# Patient Record
Sex: Male | Born: 1985 | Race: White | Hispanic: No | Marital: Single | Smoking: Current every day smoker
Health system: Southern US, Community
[De-identification: ages and names within clinical notes are randomized; demographics above are authoritative.]

## PROBLEM LIST (undated history)

## (undated) DIAGNOSIS — R9430 Abnormal result of cardiovascular function study, unspecified: Secondary | ICD-10-CM

## (undated) DIAGNOSIS — D6859 Other primary thrombophilia: Secondary | ICD-10-CM

## (undated) DIAGNOSIS — I82409 Acute embolism and thrombosis of unspecified deep veins of unspecified lower extremity: Secondary | ICD-10-CM

## (undated) DIAGNOSIS — D689 Coagulation defect, unspecified: Secondary | ICD-10-CM

## (undated) DIAGNOSIS — IMO0002 Reserved for concepts with insufficient information to code with codable children: Secondary | ICD-10-CM

## (undated) DIAGNOSIS — I2699 Other pulmonary embolism without acute cor pulmonale: Secondary | ICD-10-CM

## (undated) DIAGNOSIS — M545 Low back pain, unspecified: Secondary | ICD-10-CM

---

## 2006-05-13 ENCOUNTER — Emergency Department (HOSPITAL_COMMUNITY): Admission: EM | Admit: 2006-05-13 | Discharge: 2006-05-13 | Payer: Self-pay | Admitting: Emergency Medicine

## 2006-05-14 ENCOUNTER — Encounter (INDEPENDENT_AMBULATORY_CARE_PROVIDER_SITE_OTHER): Payer: Self-pay | Admitting: *Deleted

## 2006-05-14 ENCOUNTER — Inpatient Hospital Stay (HOSPITAL_COMMUNITY): Admission: EM | Admit: 2006-05-14 | Discharge: 2006-05-16 | Payer: Self-pay | Admitting: Emergency Medicine

## 2006-05-14 ENCOUNTER — Encounter: Payer: Self-pay | Admitting: Cardiovascular Disease

## 2006-05-14 ENCOUNTER — Ambulatory Visit: Payer: Self-pay | Admitting: Cardiovascular Disease

## 2006-05-15 ENCOUNTER — Ambulatory Visit: Payer: Self-pay | Admitting: Internal Medicine

## 2006-05-19 ENCOUNTER — Ambulatory Visit: Payer: Self-pay | Admitting: Family Medicine

## 2006-05-26 ENCOUNTER — Ambulatory Visit: Payer: Self-pay | Admitting: Internal Medicine

## 2006-06-03 ENCOUNTER — Ambulatory Visit: Payer: Self-pay | Admitting: Internal Medicine

## 2006-06-17 ENCOUNTER — Ambulatory Visit: Payer: Self-pay | Admitting: Internal Medicine

## 2006-07-01 ENCOUNTER — Ambulatory Visit: Payer: Self-pay | Admitting: Internal Medicine

## 2006-07-22 ENCOUNTER — Ambulatory Visit: Payer: Self-pay | Admitting: Internal Medicine

## 2006-09-10 ENCOUNTER — Ambulatory Visit: Payer: Self-pay | Admitting: Internal Medicine

## 2006-09-24 ENCOUNTER — Ambulatory Visit: Payer: Self-pay | Admitting: Internal Medicine

## 2006-10-01 ENCOUNTER — Ambulatory Visit: Payer: Self-pay | Admitting: Internal Medicine

## 2006-10-08 ENCOUNTER — Ambulatory Visit: Payer: Self-pay | Admitting: Internal Medicine

## 2006-10-14 ENCOUNTER — Ambulatory Visit: Payer: Self-pay | Admitting: Internal Medicine

## 2006-11-06 ENCOUNTER — Ambulatory Visit: Payer: Self-pay | Admitting: Internal Medicine

## 2006-12-08 ENCOUNTER — Ambulatory Visit: Payer: Self-pay | Admitting: Internal Medicine

## 2007-01-15 ENCOUNTER — Ambulatory Visit: Payer: Self-pay | Admitting: Internal Medicine

## 2007-02-04 ENCOUNTER — Encounter: Payer: Self-pay | Admitting: Internal Medicine

## 2007-02-04 DIAGNOSIS — I2699 Other pulmonary embolism without acute cor pulmonale: Secondary | ICD-10-CM

## 2007-02-04 DIAGNOSIS — I10 Essential (primary) hypertension: Secondary | ICD-10-CM | POA: Insufficient documentation

## 2007-02-04 DIAGNOSIS — E669 Obesity, unspecified: Secondary | ICD-10-CM

## 2007-02-04 DIAGNOSIS — R159 Full incontinence of feces: Secondary | ICD-10-CM | POA: Insufficient documentation

## 2007-12-10 ENCOUNTER — Encounter (INDEPENDENT_AMBULATORY_CARE_PROVIDER_SITE_OTHER): Payer: Self-pay | Admitting: Emergency Medicine

## 2007-12-10 ENCOUNTER — Ambulatory Visit: Payer: Self-pay | Admitting: Vascular Surgery

## 2007-12-10 ENCOUNTER — Emergency Department (HOSPITAL_COMMUNITY): Admission: EM | Admit: 2007-12-10 | Discharge: 2007-12-10 | Payer: Self-pay | Admitting: Emergency Medicine

## 2007-12-11 ENCOUNTER — Ambulatory Visit: Payer: Self-pay | Admitting: Internal Medicine

## 2007-12-11 ENCOUNTER — Ambulatory Visit: Payer: Self-pay | Admitting: Family Medicine

## 2007-12-11 DIAGNOSIS — I82409 Acute embolism and thrombosis of unspecified deep veins of unspecified lower extremity: Secondary | ICD-10-CM | POA: Insufficient documentation

## 2007-12-11 LAB — CONVERTED CEMR LAB
ALT: 47 units/L (ref 0–53)
AST: 26 units/L (ref 0–37)
Albumin: 4.8 g/dL (ref 3.5–5.2)
Alkaline Phosphatase: 54 units/L (ref 39–117)
BUN: 12 mg/dL (ref 6–23)
Basophils Absolute: 0 10*3/uL (ref 0.0–0.1)
Basophils Relative: 1 % (ref 0–1)
CO2: 24 meq/L (ref 19–32)
Calcium: 9.6 mg/dL (ref 8.4–10.5)
Chloride: 102 meq/L (ref 96–112)
Creatinine, Ser: 0.94 mg/dL (ref 0.40–1.50)
Eosinophils Absolute: 0.3 10*3/uL (ref 0.0–0.7)
Eosinophils Relative: 5 % (ref 0–5)
Glucose, Bld: 80 mg/dL (ref 70–99)
HCT: 51.1 % (ref 39.0–52.0)
Hemoglobin: 16.9 g/dL (ref 13.0–17.0)
Homocysteine: 40.2 micromoles/L — ABNORMAL HIGH (ref 4.0–15.4)
INR: 1.1
Lymphocytes Relative: 33 % (ref 12–46)
Lymphs Abs: 2.1 10*3/uL (ref 0.7–4.0)
MCHC: 33.1 g/dL (ref 30.0–36.0)
MCV: 91.3 fL (ref 78.0–100.0)
Monocytes Absolute: 0.5 10*3/uL (ref 0.1–1.0)
Monocytes Relative: 8 % (ref 3–12)
Neutro Abs: 3.4 10*3/uL (ref 1.7–7.7)
Neutrophils Relative %: 53 % (ref 43–77)
Platelets: 196 10*3/uL (ref 150–400)
Potassium: 4.2 meq/L (ref 3.5–5.3)
Prothrombin Time: 13.1 s
RBC: 5.6 M/uL (ref 4.22–5.81)
RDW: 13.1 % (ref 11.5–15.5)
Sodium: 140 meq/L (ref 135–145)
Total Bilirubin: 1.9 mg/dL — ABNORMAL HIGH (ref 0.3–1.2)
Total Protein: 7.1 g/dL (ref 6.0–8.3)
WBC: 6.4 10*3/uL (ref 4.0–10.5)

## 2007-12-22 ENCOUNTER — Ambulatory Visit: Payer: Self-pay | Admitting: Internal Medicine

## 2007-12-25 ENCOUNTER — Encounter: Payer: Self-pay | Admitting: Internal Medicine

## 2007-12-25 ENCOUNTER — Telehealth: Payer: Self-pay | Admitting: Internal Medicine

## 2009-04-04 ENCOUNTER — Emergency Department (HOSPITAL_COMMUNITY): Admission: EM | Admit: 2009-04-04 | Discharge: 2009-04-04 | Payer: Self-pay | Admitting: Emergency Medicine

## 2010-04-27 ENCOUNTER — Emergency Department (HOSPITAL_COMMUNITY): Admission: EM | Admit: 2010-04-27 | Discharge: 2010-04-27 | Payer: Self-pay | Admitting: Emergency Medicine

## 2010-04-27 ENCOUNTER — Ambulatory Visit: Payer: Self-pay | Admitting: Vascular Surgery

## 2010-05-11 ENCOUNTER — Telehealth: Payer: Self-pay | Admitting: *Deleted
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
## 2010-10-18 LAB — GC/CHLAMYDIA PROBE AMP, GENITAL
Chlamydia, DNA Probe: NEGATIVE
GC Probe Amp, Genital: NEGATIVE

## 2010-10-27 ENCOUNTER — Emergency Department (HOSPITAL_BASED_OUTPATIENT_CLINIC_OR_DEPARTMENT_OTHER)
Admission: EM | Admit: 2010-10-27 | Discharge: 2010-10-27 | Disposition: A | Discharge status: Home / Self Care | Payer: Self-pay | Attending: Emergency Medicine | Admitting: Emergency Medicine

## 2010-10-27 DIAGNOSIS — S301XXA Contusion of abdominal wall, initial encounter: Secondary | ICD-10-CM | POA: Insufficient documentation

## 2010-10-27 DIAGNOSIS — Z8679 Personal history of other diseases of the circulatory system: Secondary | ICD-10-CM | POA: Insufficient documentation

## 2010-10-27 DIAGNOSIS — F17200 Nicotine dependence, unspecified, uncomplicated: Secondary | ICD-10-CM | POA: Insufficient documentation

## 2010-10-28 ENCOUNTER — Other Ambulatory Visit (HOSPITAL_BASED_OUTPATIENT_CLINIC_OR_DEPARTMENT_OTHER): Payer: Self-pay

## 2010-11-07 ENCOUNTER — Ambulatory Visit (HOSPITAL_BASED_OUTPATIENT_CLINIC_OR_DEPARTMENT_OTHER)
Admission: RE | Admit: 2010-11-07 | Discharge: 2010-11-07 | Disposition: A | Discharge status: Home / Self Care | Payer: Self-pay | Source: Ambulatory Visit | Attending: Emergency Medicine | Admitting: Emergency Medicine

## 2010-11-07 DIAGNOSIS — R1903 Right lower quadrant abdominal swelling, mass and lump: Secondary | ICD-10-CM

## 2010-11-07 DIAGNOSIS — R1909 Other intra-abdominal and pelvic swelling, mass and lump: Secondary | ICD-10-CM | POA: Insufficient documentation

## 2010-12-21 NOTE — H&P (Signed)
NAMETIP, ATIENZA              ACCOUNT NO.:  [id number]   MEDICAL RECORD NO.:  [id number]          PATIENT TYPE:  INP   LOCATION:  1420                         FACILITY:  [facility]   PHYSICIAN:  Reginia Forts, MD     DATE OF BIRTH:  30-Dec-1985   DATE OF ADMISSION:  05/13/2006  DATE OF DISCHARGE:  05/13/2006                                HISTORY & PHYSICAL   CHIEF COMPLAINT:  1. Shortness of breath.  2. Left lower chest pain.  3. Mildly bloody sputum.   HISTORY OF PRESENT ILLNESS:  Mr. Falco is an 25 year old Caucasian male  with a history for tobacco and obesity, who presents with 6 hours of lower  left chest pain and mild hemoptysis.  The patient denies any recent extended  travel or sedentary state, any leg swelling or family history of clotting  disorders.  He states that he has been active and was cutting tobacco stalks  earlier in the day.  Later this evening he developed acute shortness of  breath with left lower chest pain, rated as 8/10, with mild hemoptysis.  He  presented to the emergency room and underwent a CT scan demonstrating  pulmonary emboli in the lobar and segmental arteries of the left lung.  The  patient was started on heparin and morphine with subsequent improvement in  symptoms.  The patient denies any recent trauma.   PAST MEDICAL HISTORY:  Notable for obesity.  The patient has lost 50 pounds  in the last 2 years.   PAST SURGICAL HISTORY:  None.   ALLERGIES:  NO KNOWN DRUG ALLERGIES.   MEDICATIONS:  None.   SOCIAL HISTORY:  The patient currently lives with his father but is moving  to [place] with his mother after this hospital stay.  He is a Nature conservation officer.  He  has smoked 1 pack a day for the last 2-3 years.  Denies any alcohol or drug  use.   FAMILY HISTORY:  Notable for premature heart disease and cancer, but no  hypercoagulable disease or blood dyscrasias.   REVIEW OF SYSTEMS:  12-point review of systems was reviewed and was  negative.   PHYSICAL EXAMINATION:  VITAL SIGNS:  Temperature is 98.5, blood pressure is  127/72, pulse is 83 and respiratory rate is 18-20, sating at 100% on 2 L.  GENERAL:  The patient is awake, alert and oriented times three, in no acute  distress.  HEENT:  Normocephalic, atraumatic.  Pupils equal, round and reactive to  light.  Extraocular movements are intact.  NECK:  Shows no JVD and no bruits.  CARDIOVASCULAR:  Regular rhythm, normal rate.  No murmurs, rubs or gallops.  LUNGS:  Clear to auscultation bilaterally.  ABDOMEN:  Positive bowel sounds.  Soft, nontender and nondistended.  EXTREMITIES:  Show no cyanosis, clubbing or edema.  Negative Homans sign.  NEUROLOGIC:  Cranial nerves II through XII are grossly intact.  No focal  musculoskeletal or sensory deficits.  MUSCULOSKELETAL:  Demonstrates no significant tenderness.  PSYCHIATRIC:  Demonstrates normal affect.   LABORATORY DATA:  D-dimer is 2.97.  BUN is 5 and creatinine is  0.7.  White  count is 13,000, hematocrit is 45.5 and platelets are 254,000.  Urinalysis  demonstrated no protein.   RADIOLOGIC DATA:  CT scan demonstrated acute pulmonary embolus to the lobar  and segmental pulmonary arteries of the left lung.   Chest x-ray demonstrated no acute cardiopulmonary process.   ASSESSMENT AND PLAN:  This is an 25 year old Caucasian male with no obvious  precipitating factors, who presents with an acute pulmonary embolism.  Risk  factors include smoking and obesity.  1. Pulmonary embolism.  The patient has been started on a heparin drip.      He will be initiated on Coumadin.  Hypercoagulable workup will be      obtained.  The patient may or may not be a candidate for Lovenox, in      light of his weight.  2. Nutrition.  The patient will need dietary counseling to help him with      his weight loss.  3. Smoking cessation.  The patient is advised to stop smoking.  4. DVT prophylaxis.  The patient has been placed on full-dose heparin and       Coumadin and will not require basic DVT prophylaxis.      Reginia Forts, MD  Electronically Signed     RA/MEDQ  D:  05/14/2006  T:  05/14/2006  Job:  [id number]

## 2010-12-21 NOTE — Discharge Summary (Signed)
NAMEBRIER, [name]              ACCOUNT NO.:  [id number]   MEDICAL RECORD NO.:  [id number]          PATIENT TYPE:  INP   LOCATION:  1420                         FACILITY:  [facility]   PHYSICIAN:  Georgina Quint. Plotnikov, MDDATE OF BIRTH:  10-19-1985   DATE OF ADMISSION:  05/13/2006  DATE OF DISCHARGE:  05/16/2006                                 DISCHARGE SUMMARY   FINAL DIAGNOSES:  1. Was admitted with chest pain and hemoptysis on May 14, 2006.  For      the details, please address __________  history and physical.  2. Elevated blood pressure.   HOSPITAL COURSE:  During the course of hospitalization, the patient was  treated with IV heparin and subsequently switched to Lovenox and Coumadin.  He had been doing progressively better with much less chest pain and  shortness of breath on exertion.  The hemoptysis has resolved.  On the day  of discharge, he is feeling well.  There are no active complaints.   PHYSICAL EXAMINATION:  VITAL SIGNS: Temperature 97.6, heart rate 83,  respirations 18, blood pressure 125/82, saturation 90% on 2 L.  GENERAL:  Looks well.  HEENT: Moist mucosa.  LUNGS:  Clear.  No wheezes or rales.  CARDIAC:  S1 and S2.  No tachycardia.  No gallop.  ABDOMEN:  Obese, soft, nontender.  EXTREMITIES:  Lower extremities without edema.  Calves nontender.  NEUROLOGIC: Alert, oriented, cooperative.   LABORATORY DATA:  INR 1.2 (was 1.1-1), white count 10.3, hemoglobin 14.5.  BMET normal.  Calcium 9.4.  Chest x-ray normal on May 14, 2006.  CT scan  with angiogram with acute pulmonary embolism to lobar and segmental  pulmonary artery of the left lung.  Two small foci atelectasis or pulmonary  infarct in the lingula and posterolateral left lower lobe.  Borderline hilar  and subcarinal adenopathy.  EKG was normal sinus rhythm, moderate voltage  criteria for LVH.   SPECIAL INSTRUCTIONS:  Low salt diet.           ______________________________  Georgina Quint. Plotnikov,  MD    AVP/MEDQ  D:  05/16/2006  T:  05/18/2006  Job:  [id number]   cc:   Neta Mends. Fabian Sharp, MD  [address] [place]  [place] [address]

## 2010-12-21 NOTE — Discharge Summary (Signed)
Casey Gray, Casey Gray              ACCOUNT NO.:  [id number]   MEDICAL RECORD NO.:  [id number]          PATIENT TYPE:  INP   LOCATION:  1420                         [facility]ACILITY:  [facility]   PHYSICIAN:  Georgina Quint. Plotnikov, MDDATE OF BIRTH:  1986-03-03   DATE OF ADMISSION:  05/13/2006  DATE OF DISCHARGE:  05/16/2006                                 DISCHARGE SUMMARY   DISCHARGE DIAGNOSES:  1. Pulmonary embolism.  2. Chest pain and hemoptysis due to #1, resolved.  3. Elevated blood pressure.  4. Obesity.   HISTORY:  He was admitted with chest pain and hemoptysis on May 14, 2006.   DISCHARGE MEDICATIONS:  1. Coumadin 5 mg daily. Follow up with Neta Mends. Panosh, MD, next week with      INR in the office.  2. Lovenox twice daily as instructed.  He will try to receive Lovenox for      free from the company.           ______________________________  Georgina Quint Plotnikov, MD     AVP/MEDQ  D:  05/21/2006  T:  05/23/2006  Job:  [id number]   cc:   Neta Mends. Fabian Sharp, MD  [address] [place]  [place] [address]

## 2011-04-08 ENCOUNTER — Emergency Department (HOSPITAL_COMMUNITY)
Admission: EM | Admit: 2011-04-08 | Discharge: 2011-04-08 | Discharge status: Against Medical Advice | Payer: Self-pay | Attending: Emergency Medicine | Admitting: Emergency Medicine

## 2011-04-08 ENCOUNTER — Ambulatory Visit (HOSPITAL_COMMUNITY)
Admission: RE | Admit: 2011-04-08 | Discharge: 2011-04-08 | Disposition: A | Discharge status: Home / Self Care | Payer: Self-pay | Source: Ambulatory Visit | Attending: Emergency Medicine | Admitting: Emergency Medicine

## 2011-04-08 DIAGNOSIS — R079 Chest pain, unspecified: Secondary | ICD-10-CM | POA: Insufficient documentation

## 2011-04-08 DIAGNOSIS — R071 Chest pain on breathing: Secondary | ICD-10-CM | POA: Insufficient documentation

## 2012-04-07 ENCOUNTER — Emergency Department (HOSPITAL_COMMUNITY)
Admission: EM | Admit: 2012-04-07 | Discharge: 2012-04-07 | Disposition: A | Discharge status: Home / Self Care | Payer: Self-pay | Attending: Emergency Medicine | Admitting: Emergency Medicine

## 2012-04-07 ENCOUNTER — Encounter (HOSPITAL_COMMUNITY): Payer: Self-pay | Admitting: Emergency Medicine

## 2012-04-07 DIAGNOSIS — G8929 Other chronic pain: Secondary | ICD-10-CM | POA: Insufficient documentation

## 2012-04-07 DIAGNOSIS — F17200 Nicotine dependence, unspecified, uncomplicated: Secondary | ICD-10-CM | POA: Insufficient documentation

## 2012-04-07 DIAGNOSIS — M545 Low back pain, unspecified: Secondary | ICD-10-CM

## 2012-04-07 DIAGNOSIS — T148XXA Other injury of unspecified body region, initial encounter: Secondary | ICD-10-CM

## 2012-04-07 DIAGNOSIS — Z86718 Personal history of other venous thrombosis and embolism: Secondary | ICD-10-CM | POA: Insufficient documentation

## 2012-04-07 DIAGNOSIS — Z86711 Personal history of pulmonary embolism: Secondary | ICD-10-CM | POA: Insufficient documentation

## 2012-04-07 HISTORY — DX: Acute embolism and thrombosis of unspecified deep veins of unspecified lower extremity: I82.409

## 2012-04-07 HISTORY — DX: Low back pain, unspecified: M54.50

## 2012-04-07 HISTORY — DX: Low back pain: M54.5

## 2012-04-07 HISTORY — DX: Other pulmonary embolism without acute cor pulmonale: I26.99

## 2012-04-07 MED ORDER — NAPROXEN 250 MG PO TABS: 250.0000 mg | ORAL_TABLET | Freq: Two times a day (BID) | ORAL | Status: AC

## 2012-04-07 MED ORDER — METHOCARBAMOL 500 MG PO TABS: 1000.0000 mg | ORAL_TABLET | Freq: Four times a day (QID) | ORAL | Status: AC | PRN

## 2012-04-07 MED ORDER — HYDROCODONE-ACETAMINOPHEN 5-325 MG PO TABS: ORAL_TABLET | ORAL | Status: AC

## 2012-04-07 MED ORDER — IBUPROFEN 400 MG PO TABS
400.0000 mg | ORAL_TABLET | Freq: Once | ORAL | Status: AC
  Administered 2012-04-07: 400 mg via ORAL
  Filled 2012-04-07: qty 1

## 2012-04-07 MED ORDER — HYDROCODONE-ACETAMINOPHEN 5-325 MG PO TABS
1.0000 | ORAL_TABLET | Freq: Once | ORAL | Status: AC
  Administered 2012-04-07: 1 via ORAL
  Filled 2012-04-07: qty 1

## 2012-04-07 NOTE — ED Notes (Signed)
Back  Pain left side  X 3 days states hurts to take a deep breath denies injury

## 2012-04-07 NOTE — ED Provider Notes (Signed)
History    This chart was scribed for Laray Anger, DO, MD by Smitty Pluck. The patient was seen in room TR11C and the patient's care was started at 5:01PM.   CSN: [id number]  Arrival date & time 04/07/12  1419   First MD Initiated Contact with Patient 04/07/12 1701      Chief Complaint  Patient presents with  . Back Pain     HPI Casey Gray is a 26 y.o. male who presents to the Emergency Department complaining of gradual onset and persistence of constant acute flair of his chronic low back "pain" for the past 3 days.  States the pain began after he bent over and lifted a bucket of water.  Denies any change in his usual chronic pain pattern.  Pain worsens with palpation of the area and body position changes. Denies incont/retention of bowel or bladder, no saddle anesthesia, no focal motor weakness, no tingling/numbness in extremities, no fevers, no injury, no abd pain.   The symptoms have been associated with no other complaints. The patient has a significant history of similar symptoms previously.    Past Medical History  Diagnosis Date  . DVT (deep venous thrombosis)   . Pulmonary embolism   . Low back pain     No past surgical history on file.    History  Substance Use Topics  . Smoking status: Current Everyday Smoker  . Smokeless tobacco: Not on file  . Alcohol Use: Yes      Review of Systems ROS: Statement: All systems negative except as marked or noted in the HPI; Constitutional: Negative for fever and chills. ; ; Eyes: Negative for eye pain, redness and discharge. ; ; ENMT: Negative for ear pain, hoarseness, nasal congestion, sinus pressure and sore throat. ; ; Cardiovascular: Negative for chest pain, palpitations, diaphoresis, dyspnea and peripheral edema. ; ; Respiratory: Negative for cough, wheezing and stridor. ; ; Gastrointestinal: Negative for nausea, vomiting, diarrhea, abdominal pain, blood in stool, hematemesis, jaundice and rectal bleeding. . ; ;  Genitourinary: Negative for dysuria, flank pain and hematuria. ; ; Musculoskeletal: +LBP. Negative for neck pain. Negative for swelling and trauma.; ; Skin: Negative for pruritus, rash, abrasions, blisters, bruising and skin lesion.; ; Neuro: Negative for headache, lightheadedness and neck stiffness. Negative for weakness, altered level of consciousness , altered mental status, extremity weakness, paresthesias, involuntary movement, seizure and syncope.       Allergies  Review of patient's allergies indicates no known allergies.  Home Medications  No current outpatient prescriptions on file.  BP 130/81  Pulse 65  Temp 98.1 F (36.7 C) (Oral)  Resp 16  SpO2 98%  Physical Exam 1705: Physical examination:  Nursing notes reviewed; Vital signs and O2 SAT reviewed;  Constitutional: Well developed, Well nourished, Well hydrated, In no acute distress; Head:  Normocephalic, atraumatic; Eyes: EOMI, PERRL, No scleral icterus; ENMT: Mouth and pharynx normal, Mucous membranes moist; Neck: Supple, Full range of motion, No lymphadenopathy; Cardiovascular: Regular rate and rhythm, No murmur, rub, or gallop; Respiratory: Breath sounds clear & equal bilaterally, No rales, rhonchi, wheezes.  Speaking full sentences with ease, Normal respiratory effort/excursion; Chest: Nontender, Movement normal; Spine:  No midline CS, TS, LS tenderness.  +TTP left lumbar paraspinal muscles.;;; Genitourinary: No CVA tenderness; Extremities: Pulses normal, No tenderness, No edema, No calf edema or asymmetry.; Neuro: AA&Ox3, Major CN grossly intact.  Speech clear. Strength 5/5 equal bilat UE's and LE's, including great toe dorsiflexion.  DTR 2/4 equal bilat UE's  and LE's.  No gross sensory deficits.  Neg straight leg raises bilat. Gait steady.;; Skin: Color normal, Warm, Dry.   ED Course  Procedures   MDM  MDM Reviewed: nursing note, previous chart and vitals      1710:  Hx of chronic low back pain.  Pt endorses acute  flair of his usual chronic pain today, that began after "lifting a bucket of water."  Denies any  change from his usual chronic pain pattern.  Pt encouraged to f/u with his PMD for good continuity of care and control of his chronic pain.  Verb understanding.       I personally performed the services described in this documentation, which was scribed in my presence. The recorded information has been reviewed and considered. Nicholle Falzon Allison Quarry, DO 04/09/12 1653

## 2012-04-07 NOTE — ED Notes (Signed)
Patient C/O Back pain for 2 days.  Pain is in his left flank. Denies lumbar pain.  States that he picked up a bucket and felt a pop in his back and states that he could not move for a few minutes. Left flank is tender to palpation and patient states that it hurts to take a deep breath. Denies change in urinary pattern.

## 2014-03-04 ENCOUNTER — Encounter (HOSPITAL_COMMUNITY): Payer: Self-pay | Admitting: Emergency Medicine

## 2014-03-04 ENCOUNTER — Emergency Department (HOSPITAL_COMMUNITY)
Admission: EM | Admit: 2014-03-04 | Discharge: 2014-03-04 | Disposition: A | Discharge status: Home / Self Care | Payer: Self-pay | Attending: Emergency Medicine | Admitting: Emergency Medicine

## 2014-03-04 ENCOUNTER — Emergency Department (HOSPITAL_COMMUNITY): Payer: Self-pay

## 2014-03-04 DIAGNOSIS — Z86711 Personal history of pulmonary embolism: Secondary | ICD-10-CM | POA: Insufficient documentation

## 2014-03-04 DIAGNOSIS — Z86718 Personal history of other venous thrombosis and embolism: Secondary | ICD-10-CM | POA: Insufficient documentation

## 2014-03-04 DIAGNOSIS — M5380 Other specified dorsopathies, site unspecified: Secondary | ICD-10-CM | POA: Insufficient documentation

## 2014-03-04 DIAGNOSIS — R079 Chest pain, unspecified: Secondary | ICD-10-CM | POA: Insufficient documentation

## 2014-03-04 DIAGNOSIS — F17200 Nicotine dependence, unspecified, uncomplicated: Secondary | ICD-10-CM | POA: Insufficient documentation

## 2014-03-04 DIAGNOSIS — M62830 Muscle spasm of back: Secondary | ICD-10-CM

## 2014-03-04 LAB — BASIC METABOLIC PANEL
Anion gap: 11 (ref 5–15)
BUN: 15 mg/dL (ref 6–23)
CALCIUM: 9.2 mg/dL (ref 8.4–10.5)
CHLORIDE: 103 meq/L (ref 96–112)
CO2: 27 mEq/L (ref 19–32)
Creatinine, Ser: 0.92 mg/dL (ref 0.50–1.35)
GFR calc Af Amer: >90 mL/min (ref >90)
Glucose, Bld: 81 mg/dL (ref 70–99)
POTASSIUM: 4.7 meq/L (ref 3.7–5.3)
Sodium: 141 mEq/L (ref 137–147)

## 2014-03-04 LAB — CBC
HEMATOCRIT: 49.2 % (ref 39.0–52.0)
Hemoglobin: 16.6 g/dL (ref 13.0–17.0)
MCH: 30.1 pg (ref 26.0–34.0)
MCHC: 33.7 g/dL (ref 30.0–36.0)
MCV: 89.3 fL (ref 78.0–100.0)
Platelets: 202 10*3/uL (ref 150–400)
RBC: 5.51 MIL/uL (ref 4.22–5.81)
RDW: 12.5 % (ref 11.5–15.5)
WBC: 6 10*3/uL (ref 4.0–10.5)

## 2014-03-04 LAB — I-STAT TROPONIN, ED: Troponin i, poc: 0.01 ng/mL (ref 0.00–0.08)

## 2014-03-04 LAB — PRO B NATRIURETIC PEPTIDE: Pro B Natriuretic peptide (BNP): 10.6 pg/mL (ref 0–125)

## 2014-03-04 LAB — PROTIME-INR
INR: 0.97 (ref 0.00–1.49)
PROTHROMBIN TIME: 12.9 s (ref 11.6–15.2)

## 2014-03-04 LAB — D-DIMER, QUANTITATIVE (NOT AT ARMC)

## 2014-03-04 MED ORDER — CYCLOBENZAPRINE HCL 10 MG PO TABS: 10.0000 mg | ORAL_TABLET | Freq: Two times a day (BID) | ORAL | Status: DC | PRN

## 2014-03-04 MED ORDER — IBUPROFEN 600 MG PO TABS: 600.0000 mg | ORAL_TABLET | Freq: Four times a day (QID) | ORAL | Status: DC | PRN

## 2014-03-04 MED ORDER — DIAZEPAM 2 MG PO TABS
2.0000 mg | ORAL_TABLET | Freq: Once | ORAL | Status: AC
  Administered 2014-03-04: 2 mg via ORAL
  Filled 2014-03-04: qty 1

## 2014-03-04 NOTE — ED Provider Notes (Signed)
CSN: [id number]     Arrival date & time 03/04/14  1310 History   None    Chief Complaint  Patient presents with  . rib pain      (Consider location/radiation/quality/duration/timing/severity/associated sxs/prior Treatment) Patient is a 28 y.o. male presenting with back pain. The history is provided by the patient and medical records.  Back Pain Location:  Lumbar spine Quality:  Stabbing Radiates to:  Does not radiate Pain severity:  Moderate Pain is:  Worse during the night Onset quality:  Sudden Duration:  3 days Timing:  Intermittent Progression:  Waxing and waning Chronicity:  New Context: not emotional stress, not falling, not lifting heavy objects, not MCA, not MVA, not pedestrian accident, not physical stress, not recent illness and not twisting   Relieved by:  None tried Worsened by:  Deep breathing, movement, twisting and lying down Ineffective treatments:  None tried Associated symptoms: no abdominal pain, no abdominal swelling, no bladder incontinence, no bowel incontinence, no chest pain, no dysuria, no fever, no headaches, no leg pain, no numbness, no paresthesias, no pelvic pain, no perianal numbness, no tingling, no weakness and no weight loss   Risk factors: lack of exercise and obesity   Risk factors: no hx of cancer, no hx of osteoporosis, no recent surgery, no steroid use and no vascular disease   Risk factors comment:  Hx of DVT/PE   Pt endorsing 2-3 days of spontaneous R paraspinal pain.  Worse with deep inspiration.  Prior DVT/PE Hx no longer on coumadin.  Pt unsure of cause of DVT/PE.   Not 2/2 to surgery as far as he knows.  No CP.  No SOB.  Past Medical History  Diagnosis Date  . DVT (deep venous thrombosis)   . Pulmonary embolism   . Low back pain    History reviewed. No pertinent past surgical history. History reviewed. No pertinent family history. History  Substance Use Topics  . Smoking status: Current Every Day Smoker  . Smokeless tobacco: Not  on file  . Alcohol Use: Yes    Review of Systems  Constitutional: Negative.  Negative for fever and weight loss.  HENT: Negative.   Eyes: Negative.   Respiratory: Negative.   Cardiovascular: Negative.  Negative for chest pain.  Gastrointestinal: Negative.  Negative for abdominal pain and bowel incontinence.  Endocrine: Negative.   Genitourinary: Negative.  Negative for bladder incontinence, dysuria and pelvic pain.  Musculoskeletal: Positive for back pain.  Skin: Negative.   Allergic/Immunologic: Negative.   Neurological: Negative.  Negative for tingling, weakness, numbness, headaches and paresthesias.  Hematological: Negative.   Psychiatric/Behavioral: Negative.       Allergies  Review of patient's allergies indicates no known allergies.  Home Medications   Prior to Admission medications   Medication Sig Start Date End Date Taking? Authorizing Provider  cyclobenzaprine (FLEXERIL) 10 MG tablet Take 1 tablet (10 mg total) by mouth 2 (two) times daily as needed for muscle spasms. 03/04/14   Gavin PoundJustin Birgitta Uhlir, MD  ibuprofen (ADVIL,MOTRIN) 600 MG tablet Take 1 tablet (600 mg total) by mouth every 6 (six) hours as needed. 03/04/14   Gavin PoundJustin Tykera Skates, MD   BP 145/90  Pulse 74  Temp(Src) 98 F (36.7 C) (Oral)  Resp 20  SpO2 97% Physical Exam  Constitutional: He is oriented to person, place, and time. He appears well-developed and well-nourished. No distress.  HENT:  Head: Normocephalic and atraumatic.  Right Ear: External ear normal.  Left Ear: External ear normal.  Nose: Nose normal.  Mouth/Throat: Oropharynx is clear and moist. No oropharyngeal exudate.  Eyes: Conjunctivae and EOM are normal. Pupils are equal, round, and reactive to light. Right eye exhibits no discharge. Left eye exhibits no discharge. No scleral icterus.  Neck: Normal range of motion. Neck supple. No JVD present. No tracheal deviation present. No thyromegaly present.  Cardiovascular: Normal rate, regular  rhythm, normal heart sounds and intact distal pulses.  Exam reveals no gallop and no friction rub.   No murmur heard. Pulmonary/Chest: Effort normal and breath sounds normal. No stridor. No respiratory distress. He has no wheezes. He has no rales. He exhibits no tenderness.  Abdominal: Soft. Bowel sounds are normal. He exhibits no distension and no mass. There is no tenderness. There is no rebound and no guarding.  Musculoskeletal: Normal range of motion. He exhibits tenderness. He exhibits no edema.       Lumbar back: He exhibits tenderness, swelling, pain and spasm. He exhibits normal range of motion, no bony tenderness, no edema, no deformity, no laceration and normal pulse.       Arms: Lymphadenopathy:    He has no cervical adenopathy.  Neurological: He is alert and oriented to person, place, and time.  Skin: Skin is warm and dry. No rash noted. He is not diaphoretic. No erythema. No pallor.  Psychiatric: He has a normal mood and affect. His behavior is normal. Judgment and thought content normal.    ED Course  Procedures (including critical care time) Labs Review Labs Reviewed  CBC  BASIC METABOLIC PANEL  PRO B NATRIURETIC PEPTIDE  PROTIME-INR  D-DIMER, QUANTITATIVE  I-STAT TROPOININ, ED    Imaging Review Dg Chest 2 View  03/04/2014   CLINICAL DATA:  Chest pain  EXAM: CHEST  2 VIEW  COMPARISON:  04/08/2011  FINDINGS: Cardiac shadow is within normal limits. The lungs are clear bilaterally. The bony structures are within normal limits.  IMPRESSION: No active cardiopulmonary disease.   Electronically Signed   By: Alcide Clever M.D.   On: 03/04/2014 14:39     EKG Interpretation None     EKG: Sinus rhythm, incomplete RBBB pattern unchanged from previous tracings, left axis deviation.   MDM   Final diagnoses:  Lumbar paraspinal muscle spasm   Pain worse with mvt.  No concerning back pain red flags.  Positional in nature, worsened by twisting.  No midline.  No trauma.  Will  get a screening Ddimer due to past Hx of DVT/PE and pain with deep inspiration.   No dysuria, F/C, N/V/D.  Low concern for kidney infection or UTI.  Remaining w/u negative for acute process.  Tx with po muscle relaxant and will d/c with NSAIDs and flexeril Rx.   Patient given return precautions for back pain.  Advised to return for worsening symptoms including chest pain, shortness of breath, severe headache, intractable nausea or vomiting, fever, or chills, inability to take medications, or other acute concerns.  Advised to follow up with PCP as needed.  Patient and family in agreement with and expressed understanding of follow plan, plan of care, and return precautions.  All questions answered prior to discharge.  Patient was discharged in stable condition with family, ambulating without difficulty.  Patient care was discussed with my attending, Dr. Rosalia Hammers.    Gavin Pound, MD 03/05/14 [phone]

## 2014-03-04 NOTE — ED Notes (Signed)
Pager [phone] 

## 2014-03-04 NOTE — ED Notes (Signed)
Dr. Brooten at bedside. 

## 2014-03-04 NOTE — ED Notes (Addendum)
Pt c/o left rib pain in back area; pt sts hx of PE and DVT; pt sts some mild SOB recently; pt sts not currently taking anticoagulants

## 2014-03-04 NOTE — Discharge Instructions (Signed)

## 2014-03-06 NOTE — ED Provider Notes (Signed)
28 y.o. Male with new onset of back pain. Morbidly obese male with left lower back pain.  Patient with history of pe and pain not reproducible .  D-dimer obtained and negative.   I performed a history and physical examination of Jaclynn Guarneriimothy B Didion and discussed his management with Dr. Celene KrasBrooten.  I agree with the history, physical, assessment, and plan of care, with the following exceptions: None  I was present for the following procedures: None Time Spent in Critical Care of the patient: None Time spent in discussions with the patient and family: 7  Astria Jordahl S    Hilario Quarryanielle S Enoch Moffa, MD 03/06/14 [phone]

## 2015-01-02 ENCOUNTER — Inpatient Hospital Stay (HOSPITAL_BASED_OUTPATIENT_CLINIC_OR_DEPARTMENT_OTHER)
Admission: EM | Admit: 2015-01-02 | Discharge: 2015-01-03 | DRG: 176 | Disposition: A | Discharge status: Home / Self Care | Payer: Self-pay | Attending: Internal Medicine | Admitting: Internal Medicine

## 2015-01-02 ENCOUNTER — Encounter (HOSPITAL_BASED_OUTPATIENT_CLINIC_OR_DEPARTMENT_OTHER): Payer: Self-pay

## 2015-01-02 ENCOUNTER — Emergency Department (HOSPITAL_BASED_OUTPATIENT_CLINIC_OR_DEPARTMENT_OTHER): Payer: Self-pay

## 2015-01-02 ENCOUNTER — Other Ambulatory Visit (HOSPITAL_BASED_OUTPATIENT_CLINIC_OR_DEPARTMENT_OTHER): Payer: Self-pay

## 2015-01-02 DIAGNOSIS — R9431 Abnormal electrocardiogram [ECG] [EKG]: Secondary | ICD-10-CM | POA: Diagnosis present

## 2015-01-02 DIAGNOSIS — I82411 Acute embolism and thrombosis of right femoral vein: Secondary | ICD-10-CM | POA: Diagnosis present

## 2015-01-02 DIAGNOSIS — F17210 Nicotine dependence, cigarettes, uncomplicated: Secondary | ICD-10-CM | POA: Diagnosis present

## 2015-01-02 DIAGNOSIS — Z86711 Personal history of pulmonary embolism: Secondary | ICD-10-CM

## 2015-01-02 DIAGNOSIS — I2699 Other pulmonary embolism without acute cor pulmonale: Principal | ICD-10-CM | POA: Diagnosis present

## 2015-01-02 DIAGNOSIS — I82412 Acute embolism and thrombosis of left femoral vein: Secondary | ICD-10-CM | POA: Diagnosis present

## 2015-01-02 DIAGNOSIS — Z6841 Body Mass Index (BMI) 40.0 and over, adult: Secondary | ICD-10-CM

## 2015-01-02 DIAGNOSIS — Z86718 Personal history of other venous thrombosis and embolism: Secondary | ICD-10-CM

## 2015-01-02 LAB — CBC WITH DIFFERENTIAL/PLATELET
BASOS PCT: 0 % (ref 0–1)
Basophils Absolute: 0 10*3/uL (ref 0.0–0.1)
Eosinophils Absolute: 0.5 10*3/uL (ref 0.0–0.7)
Eosinophils Relative: 7 % — ABNORMAL HIGH (ref 0–5)
HCT: 46.5 % (ref 39.0–52.0)
Hemoglobin: 15.3 g/dL (ref 13.0–17.0)
LYMPHS ABS: 1.9 10*3/uL (ref 0.7–4.0)
LYMPHS PCT: 25 % (ref 12–46)
MCH: 28.8 pg (ref 26.0–34.0)
MCHC: 32.9 g/dL (ref 30.0–36.0)
MCV: 87.4 fL (ref 78.0–100.0)
MONOS PCT: 11 % (ref 3–12)
Monocytes Absolute: 0.8 10*3/uL (ref 0.1–1.0)
NEUTROS PCT: 57 % (ref 43–77)
Neutro Abs: 4.4 10*3/uL (ref 1.7–7.7)
Platelets: 171 10*3/uL (ref 150–400)
RBC: 5.32 MIL/uL (ref 4.22–5.81)
RDW: 12.5 % (ref 11.5–15.5)
WBC: 7.6 10*3/uL (ref 4.0–10.5)

## 2015-01-02 LAB — PROTIME-INR
INR: 1.14 (ref 0.00–1.49)
Prothrombin Time: 14.8 seconds (ref 11.6–15.2)

## 2015-01-02 LAB — ANTITHROMBIN III: ANTITHROMB III FUNC: 113 % (ref 75–120)

## 2015-01-02 LAB — BASIC METABOLIC PANEL
Anion gap: 10 (ref 5–15)
BUN: 10 mg/dL (ref 6–20)
CO2: 26 mmol/L (ref 22–32)
CREATININE: 0.81 mg/dL (ref 0.61–1.24)
Calcium: 9.3 mg/dL (ref 8.9–10.3)
Chloride: 103 mmol/L (ref 101–111)
GFR calc Af Amer: >60 mL/min (ref >60)
GFR calc non Af Amer: >60 mL/min (ref >60)
Glucose, Bld: 98 mg/dL (ref 65–99)
Potassium: 4.2 mmol/L (ref 3.5–5.1)
SODIUM: 139 mmol/L (ref 135–145)

## 2015-01-02 LAB — TROPONIN I

## 2015-01-02 MED ORDER — HEPARIN SODIUM (PORCINE) 5000 UNIT/ML IJ SOLN: 60.0000 [IU]/kg | Freq: Once | INTRAMUSCULAR | Status: DC

## 2015-01-02 MED ORDER — SODIUM CHLORIDE 0.9 % IJ SOLN
3.0000 mL | Freq: Two times a day (BID) | INTRAMUSCULAR | Status: DC
  Administered 2015-01-02: 3 mL via INTRAVENOUS

## 2015-01-02 MED ORDER — PNEUMOCOCCAL VAC POLYVALENT 25 MCG/0.5ML IJ INJ
0.5000 mL | INJECTION | INTRAMUSCULAR | Status: AC
  Administered 2015-01-03: 0.5 mL via INTRAMUSCULAR
  Filled 2015-01-02 (×2): qty 0.5

## 2015-01-02 MED ORDER — HEPARIN (PORCINE) IN NACL 100-0.45 UNIT/ML-% IJ SOLN
1900.0000 [IU]/h | INTRAMUSCULAR | Status: DC
  Administered 2015-01-02 – 2015-01-03 (×2): 1900 [IU]/h via INTRAVENOUS
  Filled 2015-01-02 (×3): qty 250

## 2015-01-02 MED ORDER — HYDROCODONE-ACETAMINOPHEN 5-325 MG PO TABS
1.0000 | ORAL_TABLET | ORAL | Status: DC | PRN
  Administered 2015-01-02 – 2015-01-03 (×4): 1 via ORAL
  Filled 2015-01-02 (×4): qty 1

## 2015-01-02 MED ORDER — ACETAMINOPHEN 325 MG PO TABS: 650.0000 mg | ORAL_TABLET | Freq: Four times a day (QID) | ORAL | Status: DC | PRN

## 2015-01-02 MED ORDER — ACETAMINOPHEN 650 MG RE SUPP: 650.0000 mg | Freq: Four times a day (QID) | RECTAL | Status: DC | PRN

## 2015-01-02 MED ORDER — ONDANSETRON HCL 4 MG/2ML IJ SOLN: 4.0000 mg | Freq: Four times a day (QID) | INTRAMUSCULAR | Status: DC | PRN

## 2015-01-02 MED ORDER — IOHEXOL 350 MG/ML SOLN
100.0000 mL | Freq: Once | INTRAVENOUS | Status: AC | PRN
Start: 2015-01-02 — End: 2015-01-02
  Administered 2015-01-02: 100 mL via INTRAVENOUS

## 2015-01-02 MED ORDER — ONDANSETRON HCL 4 MG PO TABS: 4.0000 mg | ORAL_TABLET | Freq: Four times a day (QID) | ORAL | Status: DC | PRN

## 2015-01-02 MED ORDER — SODIUM CHLORIDE 0.9 % IV SOLN
INTRAVENOUS | Status: AC
  Administered 2015-01-02 – 2015-01-03 (×2): via INTRAVENOUS

## 2015-01-02 MED ORDER — HEPARIN BOLUS VIA INFUSION
5000.0000 [IU] | Freq: Once | INTRAVENOUS | Status: AC
  Administered 2015-01-02: 5000 [IU] via INTRAVENOUS

## 2015-01-02 NOTE — ED Notes (Signed)
C/ left lower leg pain with swelling-denies injury-hx of DVT to same

## 2015-01-02 NOTE — Progress Notes (Signed)
Pt admitted to rm 2W32. Pt alert and oriented. Pt c/o pain. No attending. [facility] paged and call back telling nurse that Dr. Arbutus Leasat would be attending. Awaiting orders. Hiram Combererek Dareld Mcauliffe RN

## 2015-01-02 NOTE — H&P (Signed)
History and Physical  Casey Gray QIO:[id number] DOB: 1986-04-29 DOA: 01/02/2015   PCP: No primary care provider on file.  Referring Physician: ED/ Dr. Donnald GarrePfeiffer  Chief Complaint: sob and Left leg pain  HPI:  29 year old male with a history of pulmonary embolus and left lower extremity DVT presented with one-week history of increasing left lower extremity pain and edema with associated shortness of breath. The patient denies any recent surgery or trauma or prolonged travel. There is no family history of thromboembolic events. The patient was diagnosed with a pulmonary embolus approximately 9 years ago. He states that he took warfarin for greater than 6 months, but he does not recall the exact duration. After he was off of the warfarin, the patient suffered a DVT in his left lower extremity approximately one year later. The patient was again placed on warfarin with which she took less than 6 months. He has not been on warfarin since that period of time. The patient states that he has not had any previous workup for hypercoagulable states. He denies any illegal drug use, but smokes one half pack per day.  His shortness of breath is primarily with exertion although he denies any chest pain at this time. Patient denies fevers, chills, headache, chest pain,  nausea, vomiting, diarrhea, abdominal pain, dysuria, hematuria In the emergency department, BMP and CBC were essentially unremarkable. EKG showed T-wave inversion in V1-before, III and avF. CT angiogram of the chest showed bilateral pulmonary embolus without any evidence of right heart strain. The patient was hemodynamically stable without any hypoxemia. Assessment/Plan: Recurrent pulmonary embolus -The patient will need lifelong anticoagulation as this is his third thrombotic event -it will not change therapy to check heritable coagulopathic states -venous duplex LLE -Echo -continue heparin for now -I have discussed the risks, benefits,  and alternatives of taking warfarin versus Factor Xa inhibitors.  Pt will make final decision am 01/03/15 Abnormal EKG -cycle troponins Morbid Obesity -BMI 42.6 -dietician consult       Past Medical History  Diagnosis Date  . DVT (deep venous thrombosis)   . Pulmonary embolism   . Low back pain    History reviewed. No pertinent past surgical history. Social History:  reports that he has been smoking.  He does not have any smokeless tobacco history on file. He reports that he drinks alcohol. He reports that he does not use illicit drugs.   Family hx: family history reviewed--no pertinent family hx  No Known Allergies    Prior to Admission medications   Not on File    Review of Systems:  Constitutional:  No weight loss, night sweats, Fevers, chills, fatigue.  Head&Eyes: No headache.  No vision loss.  No eye pain or scotoma ENT:  No Difficulty swallowing,Tooth/dental problems,Sore throat,  No ear ache, post nasal drip,  Cardio-vascular:  No chest pain, Orthopnea, PND, swelling in lower extremities,  dizziness, palpitations  GI:  No  abdominal pain, nausea, vomiting, diarrhea, loss of appetite, hematochezia, melena, heartburn, indigestion, Resp:   No cough. No coughing up of blood .No wheezing.No chest wall deformity  Skin:  no rash or lesions.  GU:  no dysuria, change in color of urine, no urgency or frequency. No flank pain.  Musculoskeletal:  No joint pain or swelling. No decreased range of motion. No back pain..  C/o LLE pain  Psych:  No change in mood or affect. No depression or anxiety. Neurologic: No headache, no dysesthesia, no focal weakness, no vision  loss. No syncope  Physical Exam: Filed Vitals:   01/02/15 1248 01/02/15 1500 01/02/15 1608  BP: 126/90 129/79 139/87  Pulse: 92 77 70  Temp: 98.6 F (37 C)  97.6 F (36.4 C)  TempSrc: Oral  Oral  Resp: 20 18   Height:  (1.803 m)    Weight: 138.347 kg (305 lb)    SpO2: 96% 97% 97%    General:  A&O x 3, NAD, nontoxic, pleasant/cooperative Head/Eye: No conjunctival hemorrhage, no icterus, [place]/AT, No nystagmus ENT:  No icterus,  No thrush, good dentition, no pharyngeal exudate Neck:  No masses, no lymphadenpathy, no bruits CV:  RRR, no rub, no gallop, no S3 Lung:  CTAB, good air movement, no wheeze, no rhonchi Abdomen: soft/NT, +BS, nondistended, no peritoneal signs Ext: No cyanosis, No rashes, No petechiae, No lymphangitis, 1+LLE edema Neuro: CNII-XII intact, strength 4/5 in bilateral upper and lower extremities, no dysmetria  Labs on Admission:  Basic Metabolic Panel:  Recent Labs Lab 01/02/15 1346  NA 139  K 4.2  CL 103  CO2 26  GLUCOSE 98  BUN 10  CREATININE 0.81  CALCIUM 9.3   Liver Function Tests: No results for input(s): AST, ALT, ALKPHOS, BILITOT, PROT, ALBUMIN in the last 168 hours. No results for input(s): LIPASE, AMYLASE in the last 168 hours. No results for input(s): AMMONIA in the last 168 hours. CBC:  Recent Labs Lab 01/02/15 1346  WBC 7.6  NEUTROABS 4.4  HGB 15.3  HCT 46.5  MCV 87.4  PLT 171   Cardiac Enzymes:  Recent Labs Lab 01/02/15 1346  TROPONINI <0.03   BNP: Invalid input(s): POCBNP CBG: No results for input(s): GLUCAP in the last 168 hours.  Radiological Exams on Admission: Ct Angio Chest Pe W/cm &/or Wo Cm  01/02/2015   CLINICAL DATA:  Mod history of deep venous thrombosis. Shortness of breath beginning 4 days ago. Left lower extremity pain and swelling.  EXAM: CT ANGIOGRAPHY CHEST WITH CONTRAST  TECHNIQUE: Multidetector CT imaging of the chest was performed using the standard protocol during bolus administration of intravenous contrast. Multiplanar CT image reconstructions and MIPs were obtained to evaluate the vascular anatomy.  CONTRAST:  100 mL OMNIPAQUE IOHEXOL 350 MG/ML SOLN  COMPARISON:  PA and lateral chest 03/04/2014 P  FINDINGS: Extensive bilateral pulmonary emboli are identified. The right ventricle to left  ventricle is 0.78. There is no pleural or pericardial effusion. No axillary, hilar or mediastinal lymphadenopathy is identified. The lungs are clear. Incidentally imaged upper abdomen is unremarkable. No focal bony abnormality is identified.  Review of the MIP images confirms the above findings.  IMPRESSION: Positive for acute PE without CT evidence of right heart strain (RV/LV Ratio = 0.78)  Critical Value/emergent results were called by telephone at the time of interpretation on 01/02/2015 at 2:02 pm to Dr. Arby Barrette , who verbally acknowledged these results.   Electronically Signed   By: Drusilla Kanner M.D.   On: 01/02/2015 14:06    EKG: Independently reviewed. Sinus with T-wave inversion V1-V4, III, avF    Time spent:60 minutes Code Status:   FULL Family Communication:   No Family at bedside   Khaniya Tenaglia, DO  [facility] Pager [phone]  If 7PM-7AM, please contact night-coverage [email or web address] Password [email or web address] 01/02/2015, 5:18 PM

## 2015-01-02 NOTE — Progress Notes (Signed)
29 year old male with PMH of previous DVT not on anticoagulation comes in with SOB,  CT angio showed PE

## 2015-01-02 NOTE — Progress Notes (Signed)
ANTICOAGULATION CONSULT NOTE - Initial Consult  Pharmacy Consult for Heparin Indication: pulmonary embolus  No Known Allergies  Patient Measurements: Height: 5\' 11"  (180.3 cm) Weight: (!) 305 lb (138.347 kg) IBW/kg (Calculated) : 75.3 Heparin Dosing Weight: 107 kg  Vital Signs: Temp: 98.6 F (37 C) (05/30 1248) Temp Source: Oral (05/30 1248) BP: 126/90 mmHg (05/30 1248) Pulse Rate: 92 (05/30 1248)  Labs:  Recent Labs  01/02/15 1346  HGB 15.3  HCT 46.5  PLT 171  LABPROT 14.8  INR 1.14  CREATININE 0.81  TROPONINI <0.03    Estimated Creatinine Clearance: 194.7 mL/min (by C-G formula based on Cr of 0.81).   Medical History: Past Medical History  Diagnosis Date  . DVT (deep venous thrombosis)   . Pulmonary embolism   . Low back pain     Medications:  F/u med rec  Assessment: 29 y/o morbidly obese male with a known h/o DVT/PE [facility] c/o LLE pain and swelling. CT 5/30 with extensive B PE's. Baseline CBC WNL. Baseline INR 1.14.   Goal of Therapy:  Heparin level 0.3-0.7 units/ml Monitor platelets by anticoagulation protocol: Yes   Plan:  Heparin 5000 unit IV bolus Heparin infusion 1900 units/hr Check heparin level in 6 hrs. Daily heparin level and CBC.   Brit Carbonell S. Merilynn Finlandobertson, PharmD, BCPS Clinical Staff Pharmacist Pager [phone]  Misty Stanleyobertson, Antwuan Eckley Stillinger 01/02/2015,2:17 PM

## 2015-01-02 NOTE — ED Provider Notes (Signed)
CSN: [id number]     Arrival date & time 01/02/15  1241 History   First MD Initiated Contact with Patient 01/02/15 1304     Chief Complaint  Patient presents with  . Leg Pain     (Consider location/radiation/quality/duration/timing/severity/associated sxs/prior Treatment) HPI 2 days of increased swelling and pain in the left lower leg. Calf feels tight. No injury. Patient has history of DVT 2. Reports he's been off of anticoag.  for several years. He doesn't think he was supposed to been permanently on anticoagulation. Endorses some shortness of breath for 2 days, mild. Denies any associated chest pain, fever or cough. Past Medical History  Diagnosis Date  . DVT (deep venous thrombosis)   . Pulmonary embolism   . Low back pain    History reviewed. No pertinent past surgical history. No family history on file. History  Substance Use Topics  . Smoking status: Current Every Day Smoker  . Smokeless tobacco: Not on file  . Alcohol Use: Yes     Comment: occ    Review of Systems  10 Systems reviewed and are negative for acute change except as noted in the HPI.   Allergies  Review of patient's allergies indicates no known allergies.  Home Medications   Prior to Admission medications   Not on File   BP 139/87 mmHg  Pulse 70  Temp(Src) 97.6 F (36.4 C) (Oral)  Resp 18  Ht  (1.803 m)  Wt 305 lb (138.347 kg)  BMI 42.56 kg/m2  SpO2 97% Physical Exam  Constitutional: He is oriented to person, place, and time.  Patient is moderately obese. He is nontoxic and alert. No respiratory distress. There is good.  HENT:  Head: Normocephalic and atraumatic.  Eyes: EOM are normal.  Neck: Neck supple.  Cardiovascular: Normal rate, regular rhythm, normal heart sounds and intact distal pulses.   Pulmonary/Chest: Effort normal and breath sounds normal. No respiratory distress.  Abdominal: Soft. Bowel sounds are normal. He exhibits no distension. There is no tenderness.   Musculoskeletal: Normal range of motion. He exhibits tenderness. He exhibits no edema.  Patient endorses tenderness to compression of the left calf and palpation the popliteal fossa. No gross edema of the lower extremity.  Neurological: He is alert and oriented to person, place, and time. He has normal strength. Coordination normal. GCS eye subscore is 4. GCS verbal subscore is 5. GCS motor subscore is 6.  Skin: Skin is warm, dry and intact.  Psychiatric: He has a normal mood and affect.    ED Course  Procedures (including critical care time) Labs Review Labs Reviewed  CBC WITH DIFFERENTIAL/PLATELET - Abnormal; Notable for the following:    Eosinophils Relative 7 (*)    All other components within normal limits  BASIC METABOLIC PANEL  TROPONIN I  PROTIME-INR  ANTITHROMBIN III  PROTEIN C ACTIVITY  PROTEIN C, TOTAL  PROTEIN S ACTIVITY  PROTEIN S, TOTAL  LUPUS ANTICOAGULANT PANEL  BETA-2-GLYCOPROTEIN I ABS, IGG/M/A  HOMOCYSTEINE  FACTOR 5 LEIDEN  PROTHROMBIN GENE MUTATION  CARDIOLIPIN ANTIBODIES, IGG, IGM, IGA  HEPARIN LEVEL (UNFRACTIONATED)  HEPARIN LEVEL (UNFRACTIONATED)  CBC    Imaging Review Ct Angio Chest Pe W/cm &/or Wo Cm  01/02/2015   CLINICAL DATA:  Mod history of deep venous thrombosis. Shortness of breath beginning 4 days ago. Left lower extremity pain and swelling.  EXAM: CT ANGIOGRAPHY CHEST WITH CONTRAST  TECHNIQUE: Multidetector CT imaging of the chest was performed using the standard protocol during bolus administration of  intravenous contrast. Multiplanar CT image reconstructions and MIPs were obtained to evaluate the vascular anatomy.  CONTRAST:  100 mL OMNIPAQUE IOHEXOL 350 MG/ML SOLN  COMPARISON:  PA and lateral chest 03/04/2014 P  FINDINGS: Extensive bilateral pulmonary emboli are identified. The right ventricle to left ventricle is 0.78. There is no pleural or pericardial effusion. No axillary, hilar or mediastinal lymphadenopathy is identified. The lungs are  clear. Incidentally imaged upper abdomen is unremarkable. No focal bony abnormality is identified.  Review of the MIP images confirms the above findings.  IMPRESSION: Positive for acute PE without CT evidence of right heart strain (RV/LV Ratio = 0.78)  Critical Value/emergent results were called by telephone at the time of interpretation on 01/02/2015 at 2:02 pm to Dr. Arby BarretteMARCY Quanetta Truss , who verbally acknowledged these results.   Electronically Signed   By: Drusilla Kannerhomas  Dalessio M.D.   On: 01/02/2015 14:06     EKG Interpretation None     CRITICAL CARE Performed by: Arby BarrettePfeiffer, Emir Nack   Total critical care time: 30  Critical care time was exclusive of separately billable procedures and treating other patients.  Critical care was necessary to treat or prevent imminent or life-threatening deterioration.  Critical care was time spent personally by me on the following activities: development of treatment plan with patient and/or surrogate as well as nursing, discussions with consultants, evaluation of patient's response to treatment, examination of patient, obtaining history from patient or surrogate, ordering and performing treatments and interventions, ordering and review of laboratory studies, ordering and review of radiographic studies, pulse oximetry and re-evaluation of patient's condition. MDM   Final diagnoses:  Pulmonary embolus   Patient presents with lower extremity pain suspicious for recurrent DVT. CT PE study identifies bilateral PE without right heart strain. Heparin infusion is initiated and patient will be transferred for admission to medical service.    Arby BarretteMarcy Sunjai Levandoski, MD 01/02/15 [phone]

## 2015-01-03 ENCOUNTER — Inpatient Hospital Stay (HOSPITAL_COMMUNITY): Payer: Self-pay

## 2015-01-03 DIAGNOSIS — I2699 Other pulmonary embolism without acute cor pulmonale: Secondary | ICD-10-CM

## 2015-01-03 LAB — HEPARIN LEVEL (UNFRACTIONATED): Heparin Unfractionated: 0.37 IU/mL (ref 0.30–0.70)

## 2015-01-03 LAB — HOMOCYSTEINE: Homocysteine: 30 umol/L — ABNORMAL HIGH (ref 0.0–15.0)

## 2015-01-03 LAB — CBC
HEMATOCRIT: 42.9 % (ref 39.0–52.0)
HEMOGLOBIN: 14.1 g/dL (ref 13.0–17.0)
MCH: 29 pg (ref 26.0–34.0)
MCHC: 32.9 g/dL (ref 30.0–36.0)
MCV: 88.3 fL (ref 78.0–100.0)
Platelets: 182 10*3/uL (ref 150–400)
RBC: 4.86 MIL/uL (ref 4.22–5.81)
RDW: 12.7 % (ref 11.5–15.5)
WBC: 8.4 10*3/uL (ref 4.0–10.5)

## 2015-01-03 LAB — TROPONIN I: Troponin I: <0.03 ng/mL (ref <0.031)

## 2015-01-03 MED ORDER — RIVAROXABAN 20 MG PO TABS: 20.0000 mg | ORAL_TABLET | Freq: Every day | ORAL | Status: AC

## 2015-01-03 MED ORDER — RIVAROXABAN 15 MG PO TABS
15.0000 mg | ORAL_TABLET | Freq: Two times a day (BID) | ORAL | Status: DC
  Administered 2015-01-03: 15 mg via ORAL
  Filled 2015-01-03 (×2): qty 1

## 2015-01-03 MED ORDER — RIVAROXABAN 15 MG PO TABS: 15.0000 mg | ORAL_TABLET | Freq: Two times a day (BID) | ORAL | Status: AC

## 2015-01-03 MED ORDER — RIVAROXABAN (XARELTO) EDUCATION KIT FOR DVT/PE PATIENTS
PACK | Freq: Once | Status: AC
  Administered 2015-01-03: 11:00:00
  Filled 2015-01-03: qty 1

## 2015-01-03 NOTE — Progress Notes (Signed)
ANTICOAGULATION CONSULT NOTE - Initial Consult  Pharmacy Consult for Xarelto Indication: pulmonary embolus and DVT  No Known Allergies  Patient Measurements: Height: 5\' 11"  (180.3 cm) Weight: (!) 305 lb (138.347 kg) IBW/kg (Calculated) : 75.3  Vital Signs: Temp: 98.5 F (36.9 C) (05/31 0404) Temp Source: Oral (05/31 0404) BP: 101/52 mmHg (05/31 0404) Pulse Rate: 65 (05/31 0404)  Labs:  Recent Labs  01/02/15 1346 01/02/15 1915 01/03/15 0121  HGB 15.3  --  14.1  HCT 46.5  --  42.9  PLT 171  --  182  LABPROT 14.8  --   --   INR 1.14  --   --   HEPARINUNFRC  --   --  0.37  CREATININE 0.81  --   --   TROPONINI <0.03 <0.03 <0.03    Estimated Creatinine Clearance: 194.7 mL/min (by C-G formula based on Cr of 0.81).   Medical History: Past Medical History  Diagnosis Date  . DVT (deep venous thrombosis)   . Pulmonary embolism   . Low back pain     Medications:  Scheduled:  . [COMPLETED] sodium chloride   Intravenous STAT  . pneumococcal 23 valent vaccine  0.5 mL Intramuscular Tomorrow-1000  . sodium chloride  3 mL Intravenous Q12H   Infusions:  . heparin 1,900 Units/hr (01/03/15 0030)    Assessment: 29 yo M with hx of DVT/PE [facility] with acute DVT/PE and started on heparin infusion.  At this time, patient will be transitioned to Xarelto in anticipation of discharge home.  Goal of Therapy:  Therapeutic anticoagulation Monitor platelets by anticoagulation protocol: Yes   Plan:  D/C heparin when first dose of Xarelto given.  D/C heparin labs. Xarelto 15mg  PO BID x 21 days, followed by Xarelto 20mg  daily therafter. Xarelto Education completed. CM referral for Xarelto Medication Assistance Program  SnyderKimberly Traci Gafford, [address] BoulevardPharm.D., BCPS Clinical Pharmacist Pager [phone] 01/03/2015 10:35 AM

## 2015-01-03 NOTE — Progress Notes (Signed)
ANTICOAGULATION CONSULT NOTE  Pharmacy Consult for Heparin Indication: pulmonary embolus  No Known Allergies  Patient Measurements: Height: 5\' 11"  (180.3 cm) Weight: (!) 305 lb (138.347 kg) IBW/kg (Calculated) : 75.3 Heparin Dosing Weight: 107 kg  Vital Signs: Temp: 97.2 F (36.2 C) (05/30 2036) Temp Source: Oral (05/30 2036) BP: 115/83 mmHg (05/30 2036) Pulse Rate: 74 (05/30 2036)  Labs:  Recent Labs  01/02/15 1346 01/02/15 1915 01/03/15 0121  HGB 15.3  --  14.1  HCT 46.5  --  42.9  PLT 171  --  182  LABPROT 14.8  --   --   INR 1.14  --   --   HEPARINUNFRC  --   --  0.37  CREATININE 0.81  --   --   TROPONINI <0.03 <0.03  --     Estimated Creatinine Clearance: 194.7 mL/min (by C-G formula based on Cr of 0.81).  Assessment: 29 y.o. male with PE for heparin  Goal of Therapy:  Heparin level 0.3-0.7 units/ml Monitor platelets by anticoagulation protocol: Yes   Plan:  Continue Heparin at current rate  Recheck level in 6 hrs to verify   Braeleigh Pyper, Gary FleetGregory Vernon 01/03/2015,2:39 AM

## 2015-01-03 NOTE — Progress Notes (Signed)
  Echocardiogram 2D Echocardiogram has been performed.  Daanya Lanphier FRANCES 01/03/2015, 8:31 AM

## 2015-01-03 NOTE — Discharge Summary (Addendum)
Physician Discharge Summary  Casey Gray WUJ:[id number] DOB: 02/16/1986 DOA: 01/02/2015  PCP: No primary care provider on file.  Admit date: 01/02/2015 Discharge date: 01/03/2015  Time spent: 25 minutes  Recommendations for Outpatient Follow-up:  1. Follow with PCP as an outpatient. 2. Follow up appointment with hematology in 2-4 week.  Discharge Diagnoses:  Active Problems:   Pulmonary embolus   Acute pulmonary embolus   Nonspecific abnormal electrocardiogram (ECG) (EKG)   Morbid obesity   Discharge Condition: stabl  Diet recommendation: regular  Filed Weights   01/02/15 1248  Weight: 138.347 kg (305 lb)    History of present illness:  29 year old male with a history of pulmonary embolus and left lower extremity DVT presented with one-week history of increasing left lower extremity pain and edema with associated shortness of breath. The patient denies any recent surgery or trauma or prolonged travel. There is no family history of thromboembolic events. The patient was diagnosed with a pulmonary embolus approximately 9 years ago. He states that he took warfarin for greater than 6 months, but he does not recall the exact duration. After he was off of the warfarin, the patient suffered a DVT in his left lower extremity approximately one year later. The patient was again placed on warfarin with which she took less than 6 months. He has not been on warfarin since that period of time. The patient states that he has not had any previous workup for hypercoagulable states. He denies any illegal drug use, but smokes one half pack per day. His shortness of breath is primarily with exertion although he denies any chest pain at this time. Patient denies fevers, chills, headache, chest pain, nausea, vomiting, diarrhea, abdominal pain, dysuria, hematuria  Hospital Course:  Recurrent acute pulmonary embolism: Patient was not on anticoagulation prior to this admission, this is third episode of  PE.  CT imaging of the chest was done that showed moderate sized PE, lower extremity Doppler showed a DVT.  He was started on IV heparin saturations heart rate and blood pressure remained stable. He was changed to answer after which she will continue as an outpatient.   Procedures:   Standard chest that showed a PE.  Lower extremity DVT positive.  Consultations:  none  Discharge Exam: Filed Vitals:   01/03/15 0404  BP: 101/52  Pulse: 65  Temp: 98.5 F (36.9 C)  Resp: 18    General: A&O x3 Cardiovascular: RRR Respiratory: good air movement CTA B/L  Discharge Instructions   Discharge Instructions    Diet - low sodium heart healthy    Complete by:  As directed      Increase activity slowly    Complete by:  As directed           Current Discharge Medication List    START taking these medications   Details  !! Rivaroxaban (XARELTO) 15 MG TABS tablet Take 1 tablet (15 mg total) by mouth 2 (two) times daily with a meal. Qty: 42 tablet, Refills: 0    !! rivaroxaban (XARELTO) 20 MG TABS tablet Take 1 tablet (20 mg total) by mouth daily with supper. Qty: 30 tablet, Refills: 0     !! - Potential duplicate medications found. Please discuss with provider.    CONTINUE these medications which have NOT CHANGED   Details  acetaminophen (TYLENOL) 500 MG tablet Take 1,000 mg by mouth every 6 (six) hours as needed for mild pain or headache.       No Known Allergies  The results of significant diagnostics from this hospitalization (including imaging, microbiology, ancillary and laboratory) are listed below for reference.    Significant Diagnostic Studies: Ct Angio Chest Pe W/cm &/or Wo Cm  01/02/2015   CLINICAL DATA:  Mod history of deep venous thrombosis. Shortness of breath beginning 4 days ago. Left lower extremity pain and swelling.  EXAM: CT ANGIOGRAPHY CHEST WITH CONTRAST  TECHNIQUE: Multidetector CT imaging of the chest was performed using the standard protocol  during bolus administration of intravenous contrast. Multiplanar CT image reconstructions and MIPs were obtained to evaluate the vascular anatomy.  CONTRAST:  100 mL OMNIPAQUE IOHEXOL 350 MG/ML SOLN  COMPARISON:  PA and lateral chest 03/04/2014 P  FINDINGS: Extensive bilateral pulmonary emboli are identified. The right ventricle to left ventricle is 0.78. There is no pleural or pericardial effusion. No axillary, hilar or mediastinal lymphadenopathy is identified. The lungs are clear. Incidentally imaged upper abdomen is unremarkable. No focal bony abnormality is identified.  Review of the MIP images confirms the above findings.  IMPRESSION: Positive for acute PE without CT evidence of right heart strain (RV/LV Ratio = 0.78)  Critical Value/emergent results were called by telephone at the time of interpretation on 01/02/2015 at 2:02 pm to Dr. Arby Barrette , who verbally acknowledged these results.   Electronically Signed   By: Drusilla Kanner M.D.   On: 01/02/2015 14:06    Microbiology: No results found for this or any previous visit (from the past 240 hour(s)).   Labs: Basic Metabolic Panel:  Recent Labs Lab 01/02/15 1346  NA 139  K 4.2  CL 103  CO2 26  GLUCOSE 98  BUN 10  CREATININE 0.81  CALCIUM 9.3   Liver Function Tests: No results for input(s): AST, ALT, ALKPHOS, BILITOT, PROT, ALBUMIN in the last 168 hours. No results for input(s): LIPASE, AMYLASE in the last 168 hours. No results for input(s): AMMONIA in the last 168 hours. CBC:  Recent Labs Lab 01/02/15 1346 01/03/15 0121  WBC 7.6 8.4  NEUTROABS 4.4  --   HGB 15.3 14.1  HCT 46.5 42.9  MCV 87.4 88.3  PLT 171 182   Cardiac Enzymes:  Recent Labs Lab 01/02/15 1346 01/02/15 1915 01/03/15 0121  TROPONINI <0.03 <0.03 <0.03   BNP: BNP (last 3 results) No results for input(s): BNP in the last 8760 hours.  ProBNP (last 3 results)  Recent Labs  03/04/14 1325  PROBNP 10.6    CBG: No results for input(s):  GLUCAP in the last 168 hours.     Signed:  Marinda Elk  [facility] 01/03/2015, 10:29 AM

## 2015-01-03 NOTE — Progress Notes (Signed)
VASCULAR LAB PRELIMINARY  PRELIMINARY  PRELIMINARY  PRELIMINARY  Bilateral lower extremity venous duplex  completed.    Preliminary report:  Right:  Non occlusive, hyper acute DVT noted in the distal CFV.  No evidence of superficial thrombosis.  No Baker's cyst.  Left: DVT noted in the distal FV, popliteal v, PTV, and peroneal v.  No evidence of superficial thrombosis.  No Baker's cyst.  Neeti Knudtson, RVT 01/03/2015, 8:53 AM

## 2015-01-03 NOTE — Discharge Instructions (Signed)
Information on my medicine - XARELTO (rivaroxaban)  This medication education was reviewed with me or my healthcare representative as part of my discharge preparation.  The pharmacist that spoke with me during my hospital stay was:  Toys 'R' UsKimberly Demonie Kassa, Pharm.D.  WHY WAS XARELTO PRESCRIBED FOR YOU? Xarelto was prescribed to treat blood clots that may have been found in the veins of your legs (deep vein thrombosis) or in your lungs (pulmonary embolism) and to reduce the risk of them occurring again.  What do you need to know about Xarelto? The starting dose is one 15 mg tablet taken TWICE daily with food for the FIRST 21 DAYS then on January 24, 2015  the dose is changed to one 20 mg tablet taken ONCE A DAY with your evening meal.  DO NOT stop taking Xarelto without talking to the health care provider who prescribed the medication.  Refill your prescription for 20 mg tablets before you run out.  After discharge, you should have regular check-up appointments with your healthcare provider that is prescribing your Xarelto.  In the future your dose may need to be changed if your kidney function changes by a significant amount.  What do you do if you miss a dose? If you are taking Xarelto TWICE DAILY and you miss a dose, take it as soon as you remember. You may take two 15 mg tablets (total 30 mg) at the same time then resume your regularly scheduled 15 mg twice daily the next day.  If you are taking Xarelto ONCE DAILY and you miss a dose, take it as soon as you remember on the same day then continue your regularly scheduled once daily regimen the next day. Do not take two doses of Xarelto at the same time.   Important Safety Information Xarelto is a blood thinner medicine that can cause bleeding. You should call your healthcare provider right away if you experience any of the following: ? Bleeding from an injury or your nose that does not stop. ? Unusual colored urine (red or dark brown) or  unusual colored stools (red or black). ? Unusual bruising for unknown reasons. ? A serious fall or if you hit your head (even if there is no bleeding).  Some medicines may interact with Xarelto and might increase your risk of bleeding while on Xarelto. To help avoid this, consult your healthcare provider or pharmacist prior to using any new prescription or non-prescription medications, including herbals, vitamins, non-steroidal anti-inflammatory drugs (NSAIDs) and supplements.  This website has more information on Xarelto: [email or web address].[email or web address].xarelto.com.

## 2015-01-03 NOTE — Progress Notes (Signed)
Discussed diet, exercise and smoking cessation with patient at bedside using the teach back method.

## 2015-01-04 LAB — CARDIOLIPIN ANTIBODIES, IGG, IGM, IGA
Anticardiolipin IgG: <9 GPL U/mL (ref 0–14)
Anticardiolipin IgM: <9 MPL U/mL (ref 0–12)

## 2015-01-04 LAB — BETA-2-GLYCOPROTEIN I ABS, IGG/M/A
Beta-2 Glyco I IgG: <9 GPI IgG units (ref 0–20)
Beta-2-Glycoprotein I IgM: <9 GPI IgM units (ref 0–32)

## 2015-01-04 LAB — PROTEIN S, TOTAL: Protein S Ag, Total: 131 % (ref 58–150)

## 2015-01-04 LAB — PROTEIN C, TOTAL: PROTEIN C, TOTAL: 119 % (ref 70–140)

## 2015-01-04 LAB — PROTEIN C ACTIVITY: Protein C Activity: 158 % — ABNORMAL HIGH (ref 74–151)

## 2015-01-04 LAB — PROTEIN S ACTIVITY: PROTEIN S ACTIVITY: 127 % (ref 60–145)

## 2015-01-04 LAB — LUPUS ANTICOAGULANT PANEL
DRVVT: 43.4 s (ref 0.0–55.1)
PTT LA: 38.4 s (ref 0.0–50.0)

## 2015-01-04 NOTE — Care Management Note (Signed)
Case Management Note  Patient Details  Name: Casey Guarneriimothy B Lantzy MRN: [id number] Date of Birth: Dec 05, 1985  Subjective/Objective:          PE, DVT          Action/Plan: Home with family Expected Discharge Date:                  Expected Discharge Plan:  Home/Self Care  In-House Referral:     Discharge planning Services  CM Consult, Medication Assistance, Indigent Health Clinic  Post Acute Care Choice:    Choice offered to:     DME Arranged:    DME Agency:     HH Arranged:    HH Agency:     Status of Service:  Completed, signed off  Medicare Important Message Given:  No Date Medicare IM Given:    Medicare IM give by:    Date Additional Medicare IM Given:    Additional Medicare Important Message give by:     If discussed at Long Length of Stay Meetings, dates discussed:    Additional Comments: Late note 01/03/2015 1520 NCM spoke to pt and provided pt with Xarelto 30 day free trial card and brochure with info on side effects. Arranged appt with [facility] for 01/06/2015 at 3:45. Provided pt with patient assistance form for Xarelto. Explained to pt he will need to take PAP form to his appt with a copy of income statement from [employer] stating he does not work. Provided pt with [employer] office contact and address. Pt to call Hematologist office to arrange appt.  Elliot CousinShavis, Jermayne Sweeney Ellen, RN 01/04/2015, 3:22 PM

## 2015-01-05 LAB — PROTHROMBIN GENE MUTATION

## 2015-01-05 LAB — FACTOR 5 LEIDEN

## 2015-01-06 ENCOUNTER — Inpatient Hospital Stay: Payer: Self-pay | Admitting: Family Medicine

## 2016-02-07 ENCOUNTER — Emergency Department (HOSPITAL_COMMUNITY): Payer: Self-pay

## 2016-02-07 ENCOUNTER — Encounter (HOSPITAL_COMMUNITY): Payer: Self-pay

## 2016-02-07 ENCOUNTER — Emergency Department (HOSPITAL_COMMUNITY)
Admission: EM | Admit: 2016-02-07 | Discharge: 2016-02-07 | Disposition: A | Discharge status: Home / Self Care | Payer: Self-pay | Attending: Emergency Medicine | Admitting: Emergency Medicine

## 2016-02-07 ENCOUNTER — Other Ambulatory Visit: Payer: Self-pay

## 2016-02-07 DIAGNOSIS — J189 Pneumonia, unspecified organism: Secondary | ICD-10-CM | POA: Insufficient documentation

## 2016-02-07 DIAGNOSIS — Z7901 Long term (current) use of anticoagulants: Secondary | ICD-10-CM | POA: Insufficient documentation

## 2016-02-07 DIAGNOSIS — F17210 Nicotine dependence, cigarettes, uncomplicated: Secondary | ICD-10-CM | POA: Insufficient documentation

## 2016-02-07 DIAGNOSIS — Z7982 Long term (current) use of aspirin: Secondary | ICD-10-CM | POA: Insufficient documentation

## 2016-02-07 LAB — BASIC METABOLIC PANEL
Anion gap: 9 (ref 5–15)
BUN: 7 mg/dL (ref 6–20)
CHLORIDE: 101 mmol/L (ref 101–111)
CO2: 25 mmol/L (ref 22–32)
CREATININE: 0.83 mg/dL (ref 0.61–1.24)
Calcium: 9.1 mg/dL (ref 8.9–10.3)
GFR calc Af Amer: >60 mL/min (ref >60)
GFR calc non Af Amer: >60 mL/min (ref >60)
Glucose, Bld: 106 mg/dL — ABNORMAL HIGH (ref 65–99)
Potassium: 3.7 mmol/L (ref 3.5–5.1)
SODIUM: 135 mmol/L (ref 135–145)

## 2016-02-07 LAB — CBC
HCT: 48.5 % (ref 39.0–52.0)
Hemoglobin: 16.2 g/dL (ref 13.0–17.0)
MCH: 29.8 pg (ref 26.0–34.0)
MCHC: 33.4 g/dL (ref 30.0–36.0)
MCV: 89.2 fL (ref 78.0–100.0)
Platelets: 198 10*3/uL (ref 150–400)
RBC: 5.44 MIL/uL (ref 4.22–5.81)
RDW: 12.8 % (ref 11.5–15.5)
WBC: 9.4 10*3/uL (ref 4.0–10.5)

## 2016-02-07 LAB — I-STAT TROPONIN, ED: Troponin i, poc: 0.01 ng/mL (ref 0.00–0.08)

## 2016-02-07 MED ORDER — GUAIFENESIN ER 1200 MG PO TB12: 1.0000 | ORAL_TABLET | Freq: Two times a day (BID) | ORAL | Status: AC

## 2016-02-07 MED ORDER — DOXYCYCLINE HYCLATE 100 MG PO CAPS: 100.0000 mg | ORAL_CAPSULE | Freq: Two times a day (BID) | ORAL | Status: AC

## 2016-02-07 MED ORDER — ACETAMINOPHEN 500 MG PO TABS
1000.0000 mg | ORAL_TABLET | Freq: Once | ORAL | Status: AC
  Administered 2016-02-07: 1000 mg via ORAL
  Filled 2016-02-07: qty 2

## 2016-02-07 MED ORDER — IPRATROPIUM-ALBUTEROL 0.5-2.5 (3) MG/3ML IN SOLN
3.0000 mL | Freq: Once | RESPIRATORY_TRACT | Status: AC
  Administered 2016-02-07: 3 mL via RESPIRATORY_TRACT
  Filled 2016-02-07: qty 3

## 2016-02-07 MED ORDER — KETOROLAC TROMETHAMINE 30 MG/ML IJ SOLN
30.0000 mg | Freq: Once | INTRAMUSCULAR | Status: AC
  Administered 2016-02-07: 30 mg via INTRAVENOUS
  Filled 2016-02-07: qty 1

## 2016-02-07 MED ORDER — IOPAMIDOL (ISOVUE-370) INJECTION 76%
INTRAVENOUS | Status: AC
  Administered 2016-02-07: 100 mL
  Filled 2016-02-07: qty 100

## 2016-02-07 MED ORDER — ALBUTEROL SULFATE HFA 108 (90 BASE) MCG/ACT IN AERS
2.0000 | INHALATION_SPRAY | RESPIRATORY_TRACT | Status: DC | PRN
  Administered 2016-02-07: 2 via RESPIRATORY_TRACT
  Filled 2016-02-07: qty 6.7

## 2016-02-07 MED ORDER — DEXTROSE 5 % IV SOLN
1.0000 g | Freq: Once | INTRAVENOUS | Status: AC
  Administered 2016-02-07: 1 g via INTRAVENOUS
  Filled 2016-02-07: qty 10

## 2016-02-07 MED ORDER — SODIUM CHLORIDE 0.9 % IV BOLUS (SEPSIS)
1000.0000 mL | Freq: Once | INTRAVENOUS | Status: AC
  Administered 2016-02-07: 1000 mL via INTRAVENOUS

## 2016-02-07 MED ORDER — PROMETHAZINE-DM 6.25-15 MG/5ML PO SYRP: 5.0000 mL | ORAL_SOLUTION | Freq: Four times a day (QID) | ORAL | Status: AC | PRN

## 2016-02-07 NOTE — ED Notes (Signed)
Patient complains of cough and shortness of breath x 4 days with occipital headache. Patient concerned that he may have a blood clot. Has had DVT and PE in past. Reports BP running high. NAD

## 2016-02-07 NOTE — ED Provider Notes (Signed)
CSN: [id number]     Arrival date & time 02/07/16  1047 History   First MD Initiated Contact with Patient 02/07/16 1058     Chief Complaint  Patient presents with  . Cough  . Shortness of Breath     (Consider location/radiation/quality/duration/timing/severity/associated sxs/prior Treatment) HPI Patient presents to the emergency department with cough and shortness of breath over the last week.  The patient states the symptoms got worse over that timeframe.  The patient did not take any medications prior to arrival.  The patient, states she has had a blood clot in the past.  The blood thinners.  Patient's concerned that he may have a blood clot in his lung. The patient denies chest pain,  headache,blurred vision, neck pain, fever,weakness, numbness, dizziness, anorexia, edema, abdominal pain, nausea, vomiting, diarrhea, rash, back pain, dysuria, hematemesis, bloody stool, near syncope, or syncope. Past Medical History  Diagnosis Date  . DVT (deep venous thrombosis) (HCC)   . Pulmonary embolism (HCC)   . Low back pain    History reviewed. No pertinent past surgical history. No family history on file. Social History  Substance Use Topics  . Smoking status: Current Every Day Smoker -- 0.50 packs/day for 10 years    Types: Cigarettes  . Smokeless tobacco: Never Used  . Alcohol Use: Yes     Comment: occ    Review of Systems   All other systems negative except as documented in the HPI. All pertinent positives and negatives as reviewed in the HPI. Allergies  Review of patient's allergies indicates no known allergies.  Home Medications   Prior to Admission medications   Medication Sig Start Date End Date Taking? Authorizing Provider  acetaminophen (TYLENOL) 500 MG tablet Take 1,000 mg by mouth every 6 (six) hours as needed for mild pain or headache.   Yes Historical Provider, MD  aspirin EC 81 MG tablet Take 81 mg by mouth daily.   Yes Historical Provider, MD  Rivaroxaban (XARELTO) 15  MG TABS tablet Take 1 tablet (15 mg total) by mouth 2 (two) times daily with a meal. 01/03/15 01/24/15  Marinda ElkAbraham Feliz Ortiz, MD  rivaroxaban (XARELTO) 20 MG TABS tablet Take 1 tablet (20 mg total) by mouth daily with supper. Patient not taking: Reported on 02/07/2016 01/03/15   Marinda ElkAbraham Feliz Ortiz, MD   BP 104/55 mmHg  Pulse 83  Temp(Src) 97.5 F (36.4 C)  Resp 20  Ht 5\' 10"  (1.778 m)  Wt 131.543 kg  BMI 41.61 kg/m2  SpO2 99% Physical Exam  Constitutional: He is oriented to person, place, and time. He appears well-developed and well-nourished. No distress.  HENT:  Head: Normocephalic and atraumatic.  Mouth/Throat: Oropharynx is clear and moist.  Eyes: Pupils are equal, round, and reactive to light.  Neck: Normal range of motion. Neck supple.  Cardiovascular: Normal rate, regular rhythm and normal heart sounds.  Exam reveals no gallop and no friction rub.   No murmur heard. Pulmonary/Chest: Effort normal. No respiratory distress. He has wheezes. He has no rales.  Abdominal: Soft. Bowel sounds are normal. He exhibits no distension. There is no tenderness.  Musculoskeletal: He exhibits no edema.  Neurological: He is alert and oriented to person, place, and time. He exhibits normal muscle tone. Coordination normal.  Skin: Skin is warm and dry. No rash noted. No erythema.  Psychiatric: He has a normal mood and affect. His behavior is normal.  Nursing note and vitals reviewed.   ED Course  Procedures (including critical care time)  Labs Review Labs Reviewed  BASIC METABOLIC PANEL - Abnormal; Notable for the following:    Glucose, Bld 106 (*)    All other components within normal limits  CBC  I-STAT TROPOININ, ED    Imaging Review Dg Chest 2 View  02/07/2016  CLINICAL DATA:  Shortness of breath and productive cough. EXAM: CHEST  2 VIEW COMPARISON:  CT scan 01/02/2015. FINDINGS: Patchy airspace disease identified at the left lung bases compatible with pneumonia. Right lung is clear. The  cardiopericardial silhouette is within normal limits for size. The visualized bony structures of the thorax are intact. IMPRESSION: Left basilar pneumonia. Electronically Signed   By: Kennith CenterEric  Mansell M.D.   On: 02/07/2016 11:56   Ct Angio Chest Pe W/cm &/or Wo Cm  02/07/2016  CLINICAL DATA:  Cough for 4 days. History of PE with similar symptoms. EXAM: CT ANGIOGRAPHY CHEST WITH CONTRAST TECHNIQUE: Multidetector CT imaging of the chest was performed using the standard protocol during bolus administration of intravenous contrast. Multiplanar CT image reconstructions and MIPs were obtained to evaluate the vascular anatomy. CONTRAST:  77 cc Isovue 370 intravenous COMPARISON:  01/02/2015 FINDINGS: Cardiovascular: Linear and eccentric pulmonary artery filling defects, greatest in the lower lobes, consistent with webs. No acute morphology pulmonary filling defect is seen. No evidence of acute aortic syndrome. Normal heart size. No pericardial effusion. Coronary atherosclerotic calcification seen in the left circulation at LAD bifucation. Mediastinum: New hilar, subcarinal, and paratracheal adenopathy. Subcarinal node measures 17 mm short axis. Lungs/Pleura: Confluent airspace disease throughout most of the left lower lobe with patchy clusters of centrilobular nodularity in the right upper, right middle, and left upper lobes. Generalized airway thickening. No cavitation or effusion. Upper abdomen: No acute findings. Musculoskeletal: No chest wall mass or suspicious bone lesions identified. Review of the MIP images confirms the above findings. IMPRESSION: 1. Multi lobar pneumonia, most extensive in the left lower lobe. 2. No evidence of acute pulmonary embolism. 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 3. Coronary atherosclerosis, age advanced. 4. New mediastinal and hilar adenopathy. This could be reactive but is more extensive than typically seen with community acquired pneumonia. Recommend  outpatient workup for sarcoidosis or lymphoproliferative disease. Electronically Signed   By: Marnee SpringJonathon  Watts M.D.   On: 02/07/2016 13:31   I have personally reviewed and evaluated these images and lab results as part of my medical decision-making.   EKG Interpretation   Date/Time:  Wednesday February 07 2016 10:53:29 EDT Ventricular Rate:  78 PR Interval:  132 QRS Duration: 106 QT Interval:  378 QTC Calculation: 430 R Axis:   -23 Text Interpretation:  Normal sinus rhythm with sinus arrhythmia Incomplete  right bundle branch block Possible Inferior infarct , age undetermined  Abnormal ECG No significant change since last tracing Confirmed by [facility]  MD, Barbara CowerJASON [phone]([id number]) on 02/07/2016 10:58:49 AM Also confirmed by [facility] MD,  Barbara CowerJASON [phone]), editor Dan HumphreysWALKER, CCT, SANDRA ([id number])  on 02/07/2016 1:22:48 PM      Patient retreated for community-acquired pneumonia.  Patient is stable.  The patient would like to be discharged home as he is feeling some better following the breathing treatment.  The patient is to need to return here for any worsening in his condition   Charlestine NightChristopher Maham Quintin, PA-C 02/11/16 2116  Marily MemosJason Mesner, MD 02/11/16 [phone]

## 2016-02-07 NOTE — Discharge Instructions (Signed)
Return here as needed.  Follow-up with your primary doctor. °

## 2016-02-07 NOTE — ED Notes (Signed)
D/C instructions reviewed with patient and he denies any questions and verbalizes understanding of medications.

## 2020-08-20 ENCOUNTER — Emergency Department (HOSPITAL_COMMUNITY): Payer: Self-pay

## 2020-08-20 ENCOUNTER — Encounter (HOSPITAL_COMMUNITY): Payer: Self-pay

## 2020-08-20 ENCOUNTER — Other Ambulatory Visit: Payer: Self-pay

## 2020-08-20 ENCOUNTER — Emergency Department (HOSPITAL_COMMUNITY)
Admission: EM | Admit: 2020-08-20 | Discharge: 2020-08-20 | Disposition: A | Discharge status: Home / Self Care | Payer: Self-pay | Attending: Emergency Medicine | Admitting: Emergency Medicine

## 2020-08-20 DIAGNOSIS — I2782 Chronic pulmonary embolism: Secondary | ICD-10-CM

## 2020-08-20 DIAGNOSIS — T50901A Poisoning by unspecified drugs, medicaments and biological substances, accidental (unintentional), initial encounter: Secondary | ICD-10-CM | POA: Insufficient documentation

## 2020-08-20 DIAGNOSIS — F1910 Other psychoactive substance abuse, uncomplicated: Secondary | ICD-10-CM | POA: Insufficient documentation

## 2020-08-20 DIAGNOSIS — I10 Essential (primary) hypertension: Secondary | ICD-10-CM | POA: Insufficient documentation

## 2020-08-20 DIAGNOSIS — I2699 Other pulmonary embolism without acute cor pulmonale: Secondary | ICD-10-CM | POA: Insufficient documentation

## 2020-08-20 DIAGNOSIS — F17210 Nicotine dependence, cigarettes, uncomplicated: Secondary | ICD-10-CM | POA: Insufficient documentation

## 2020-08-20 DIAGNOSIS — Z7982 Long term (current) use of aspirin: Secondary | ICD-10-CM | POA: Insufficient documentation

## 2020-08-20 DIAGNOSIS — Z7901 Long term (current) use of anticoagulants: Secondary | ICD-10-CM | POA: Insufficient documentation

## 2020-08-20 LAB — CBC WITH DIFFERENTIAL/PLATELET
Abs Immature Granulocytes: 0.06 10*3/uL (ref 0.00–0.07)
Basophils Absolute: 0 10*3/uL (ref 0.0–0.1)
Basophils Relative: 0 %
Eosinophils Absolute: 0.1 10*3/uL (ref 0.0–0.5)
Eosinophils Relative: 1 %
HCT: 49.1 % (ref 39.0–52.0)
Hemoglobin: 15.6 g/dL (ref 13.0–17.0)
Immature Granulocytes: 1 %
Lymphocytes Relative: 5 %
Lymphs Abs: 0.7 10*3/uL (ref 0.7–4.0)
MCH: 30.4 pg (ref 26.0–34.0)
MCHC: 31.8 g/dL (ref 30.0–36.0)
MCV: 95.7 fL (ref 80.0–100.0)
Monocytes Absolute: 0.8 10*3/uL (ref 0.1–1.0)
Monocytes Relative: 6 %
Neutro Abs: 11.2 10*3/uL — ABNORMAL HIGH (ref 1.7–7.7)
Neutrophils Relative %: 87 %
Platelets: 204 10*3/uL (ref 150–400)
RBC: 5.13 MIL/uL (ref 4.22–5.81)
RDW: 12.9 % (ref 11.5–15.5)
WBC: 12.8 10*3/uL — ABNORMAL HIGH (ref 4.0–10.5)
nRBC: 0 % (ref 0.0–0.2)

## 2020-08-20 LAB — COMPREHENSIVE METABOLIC PANEL
ALT: 27 U/L (ref 0–44)
AST: 23 U/L (ref 15–41)
Albumin: 3.9 g/dL (ref 3.5–5.0)
Alkaline Phosphatase: 49 U/L (ref 38–126)
Anion gap: 8 (ref 5–15)
BUN: 12 mg/dL (ref 6–20)
CO2: 28 mmol/L (ref 22–32)
Calcium: 8.8 mg/dL — ABNORMAL LOW (ref 8.9–10.3)
Chloride: 103 mmol/L (ref 98–111)
Creatinine, Ser: 0.89 mg/dL (ref 0.61–1.24)
GFR, Estimated: >60 mL/min (ref >60)
Glucose, Bld: 104 mg/dL — ABNORMAL HIGH (ref 70–99)
Potassium: 3.7 mmol/L (ref 3.5–5.1)
Sodium: 139 mmol/L (ref 135–145)
Total Bilirubin: 0.7 mg/dL (ref 0.3–1.2)
Total Protein: 6.8 g/dL (ref 6.5–8.1)

## 2020-08-20 LAB — ACETAMINOPHEN LEVEL
Acetaminophen (Tylenol), Serum: <10 ug/mL — ABNORMAL LOW (ref 10–30)
Acetaminophen (Tylenol), Serum: <10 ug/mL — ABNORMAL LOW (ref 10–30)

## 2020-08-20 LAB — ETHANOL: Alcohol, Ethyl (B): <10 mg/dL (ref <10)

## 2020-08-20 LAB — SALICYLATE LEVEL: Salicylate Lvl: <7 mg/dL — ABNORMAL LOW (ref 7.0–30.0)

## 2020-08-20 MED ORDER — IOHEXOL 350 MG/ML SOLN
100.0000 mL | Freq: Once | INTRAVENOUS | Status: AC | PRN
  Administered 2020-08-20: 100 mL via INTRAVENOUS

## 2020-08-20 MED ORDER — RIVAROXABAN 20 MG PO TABS
20.0000 mg | ORAL_TABLET | Freq: Every day | ORAL | Status: DC
  Administered 2020-08-20: 20 mg via ORAL
  Filled 2020-08-20: qty 1

## 2020-08-20 NOTE — ED Notes (Signed)
Pt paced on 2L [place] at this time for SpO2 of 91% while sleeping. Pt remains arousable, alert and oriented at this time. Bed is locked in the lowest position, side rails x2, call bell within reach. Cardiac monitor in pace and vital signs cycling q30 minutes.

## 2020-08-20 NOTE — Discharge Instructions (Addendum)
Make sure you take your xarelto every day.
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
## 2020-08-20 NOTE — ED Notes (Signed)
Pt transported to CT by CT staff at this time.  

## 2020-08-20 NOTE — ED Notes (Signed)
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
## 2020-08-20 NOTE — ED Notes (Signed)
PA at bedside at this time.  

## 2020-08-20 NOTE — ED Notes (Signed)
X Ray at bedside at this time.  

## 2021-05-10 ENCOUNTER — Other Ambulatory Visit: Payer: Self-pay

## 2021-05-10 ENCOUNTER — Emergency Department (HOSPITAL_COMMUNITY): Payer: Self-pay

## 2021-05-10 ENCOUNTER — Emergency Department (HOSPITAL_COMMUNITY)
Admission: EM | Admit: 2021-05-10 | Discharge: 2021-05-11 | Disposition: A | Discharge status: Home / Self Care | Payer: Self-pay | Attending: Emergency Medicine | Admitting: Emergency Medicine

## 2021-05-10 ENCOUNTER — Encounter (HOSPITAL_COMMUNITY): Payer: Self-pay

## 2021-05-10 DIAGNOSIS — T1490XA Injury, unspecified, initial encounter: Secondary | ICD-10-CM

## 2021-05-10 DIAGNOSIS — R400 Somnolence: Secondary | ICD-10-CM | POA: Insufficient documentation

## 2021-05-10 DIAGNOSIS — J9811 Atelectasis: Secondary | ICD-10-CM | POA: Insufficient documentation

## 2021-05-10 DIAGNOSIS — Y900 Blood alcohol level of less than 20 mg/100 ml: Secondary | ICD-10-CM | POA: Insufficient documentation

## 2021-05-10 DIAGNOSIS — F17210 Nicotine dependence, cigarettes, uncomplicated: Secondary | ICD-10-CM | POA: Insufficient documentation

## 2021-05-10 DIAGNOSIS — M545 Low back pain, unspecified: Secondary | ICD-10-CM | POA: Insufficient documentation

## 2021-05-10 DIAGNOSIS — Z23 Encounter for immunization: Secondary | ICD-10-CM | POA: Insufficient documentation

## 2021-05-10 DIAGNOSIS — Y92410 Unspecified street and highway as the place of occurrence of the external cause: Secondary | ICD-10-CM | POA: Insufficient documentation

## 2021-05-10 DIAGNOSIS — Z7901 Long term (current) use of anticoagulants: Secondary | ICD-10-CM | POA: Insufficient documentation

## 2021-05-10 DIAGNOSIS — S30811A Abrasion of abdominal wall, initial encounter: Secondary | ICD-10-CM | POA: Insufficient documentation

## 2021-05-10 HISTORY — DX: Reserved for concepts with insufficient information to code with codable children: IMO0002

## 2021-05-10 HISTORY — DX: Acute embolism and thrombosis of unspecified deep veins of unspecified lower extremity: I82.409

## 2021-05-10 HISTORY — DX: Abnormal result of cardiovascular function study, unspecified: R94.30

## 2021-05-10 HISTORY — DX: Other primary thrombophilia: D68.59

## 2021-05-10 HISTORY — DX: Coagulation defect, unspecified: D68.9

## 2021-05-10 LAB — CBC
HCT: 49.8 % (ref 39.0–52.0)
Hemoglobin: 16.2 g/dL (ref 13.0–17.0)
MCH: 30.5 pg (ref 26.0–34.0)
MCHC: 32.5 g/dL (ref 30.0–36.0)
MCV: 93.8 fL (ref 80.0–100.0)
Platelets: 218 10*3/uL (ref 150–400)
RBC: 5.31 MIL/uL (ref 4.22–5.81)
RDW: 12.6 % (ref 11.5–15.5)
WBC: 9 10*3/uL (ref 4.0–10.5)
nRBC: 0 % (ref 0.0–0.2)

## 2021-05-10 LAB — COMPREHENSIVE METABOLIC PANEL
ALT: 33 U/L (ref 0–44)
AST: 31 U/L (ref 15–41)
Albumin: 3.9 g/dL (ref 3.5–5.0)
Alkaline Phosphatase: 51 U/L (ref 38–126)
Anion gap: 9 (ref 5–15)
BUN: 13 mg/dL (ref 6–20)
CO2: 26 mmol/L (ref 22–32)
Calcium: 9.1 mg/dL (ref 8.9–10.3)
Chloride: 104 mmol/L (ref 98–111)
Creatinine, Ser: 1.02 mg/dL (ref 0.61–1.24)
GFR, Estimated: >60 mL/min (ref >60)
Glucose, Bld: 126 mg/dL — ABNORMAL HIGH (ref 70–99)
Potassium: 3.8 mmol/L (ref 3.5–5.1)
Sodium: 139 mmol/L (ref 135–145)
Total Bilirubin: 0.6 mg/dL (ref 0.3–1.2)
Total Protein: 6.5 g/dL (ref 6.5–8.1)

## 2021-05-10 LAB — SAMPLE TO BLOOD BANK

## 2021-05-10 LAB — LACTIC ACID, PLASMA: Lactic Acid, Venous: 1.6 mmol/L (ref 0.5–1.9)

## 2021-05-10 LAB — ETHANOL: Alcohol, Ethyl (B): <10 mg/dL (ref <10)

## 2021-05-10 MED ORDER — TETANUS-DIPHTH-ACELL PERTUSSIS 5-2.5-18.5 LF-MCG/0.5 IM SUSY
0.5000 mL | PREFILLED_SYRINGE | Freq: Once | INTRAMUSCULAR | Status: AC
  Administered 2021-05-10: 0.5 mL via INTRAMUSCULAR
  Filled 2021-05-10: qty 0.5

## 2021-05-10 MED ORDER — IOHEXOL 350 MG/ML SOLN
80.0000 mL | Freq: Once | INTRAVENOUS | Status: AC | PRN
  Administered 2021-05-10: 80 mL via INTRAVENOUS
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
## 2021-05-10 NOTE — ED Notes (Signed)
[employer] to PT

## 2021-05-10 NOTE — ED Notes (Signed)
Patient arrived
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
## 2021-05-10 NOTE — Discharge Instructions (Addendum)
All your imaging was negative for any acute injuries.  On Motrin for any pain or soreness that you develop over the next few days.  You can take this alternating every 4 hours.  Follow-up with your primary care doctor in the next week for reevaluation.  Return to the emergency department for new or worsening symptoms including chest pain, shortness of breath, loss of consciousness, altered mental status.
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
## 2021-05-11 ENCOUNTER — Encounter (HOSPITAL_COMMUNITY): Payer: Self-pay

## 2021-05-11 LAB — PROTIME-INR
INR: 1 (ref 0.8–1.2)
Prothrombin Time: 13.2 seconds (ref 11.4–15.2)

## 2021-05-11 MED ORDER — APIXABAN 5 MG PO TABS: 5.0000 mg | ORAL_TABLET | Freq: Two times a day (BID) | ORAL | 0 refills | Status: AC

## 2021-05-11 MED ORDER — APIXABAN 5 MG PO TABS
5.0000 mg | ORAL_TABLET | Freq: Once | ORAL | Status: AC
  Administered 2021-05-11: 5 mg via ORAL
  Filled 2021-05-11: qty 1

## 2022-03-28 IMAGING — CT CT HEAD W/O CM
4 series · 17 of 47 positions shown, 19 images · non-contrast
Comparison: None.

CLINICAL DATA: Status post trauma.

EXAM:
CT HEAD WITHOUT CONTRAST
TECHNIQUE: Contiguous axial images were obtained from the base of the skull
through the vertex without intravenous contrast.

[Series 3: head without · axial · non-contrast · 0.47mm/px · z∈[-573,-453]mm · 7 of 34 slices shown, 9 images]
[im 5/34  brain]
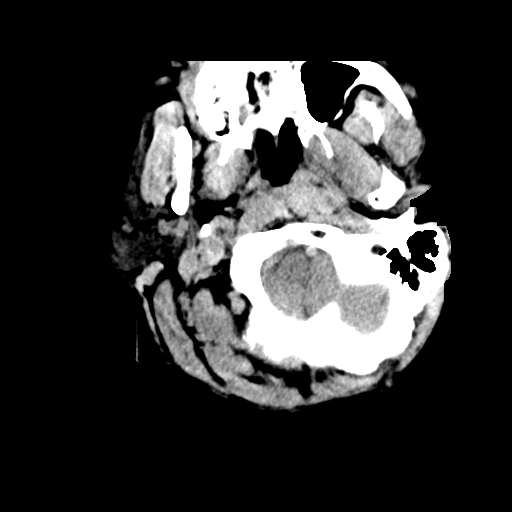
[im 5/34  bone]
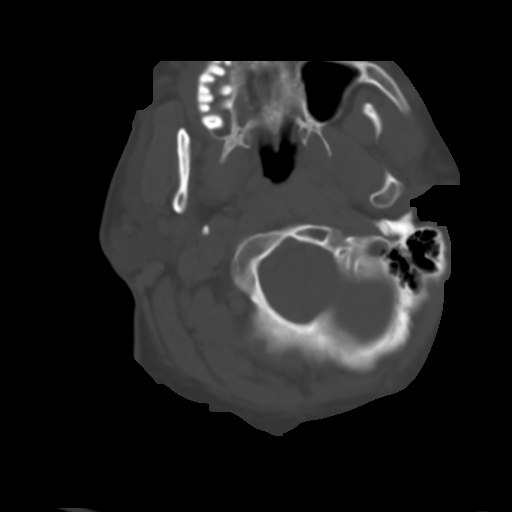
[im 9/34  brain]
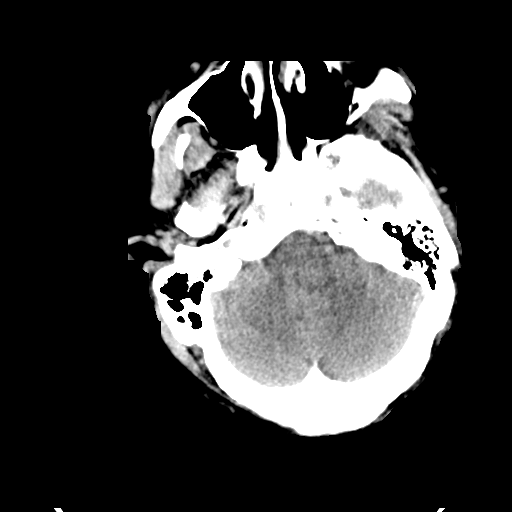
[im 13/34  brain]
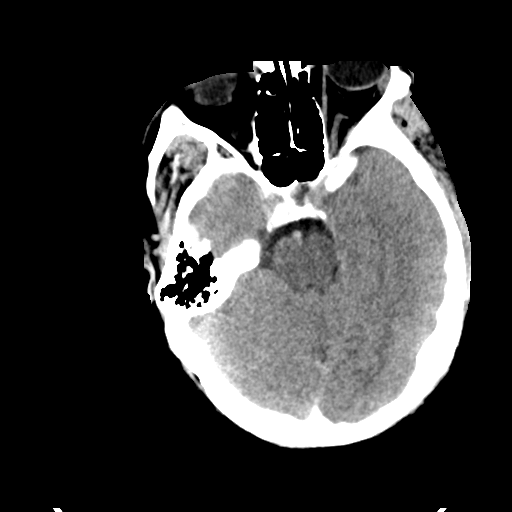
[im 17/34  brain]
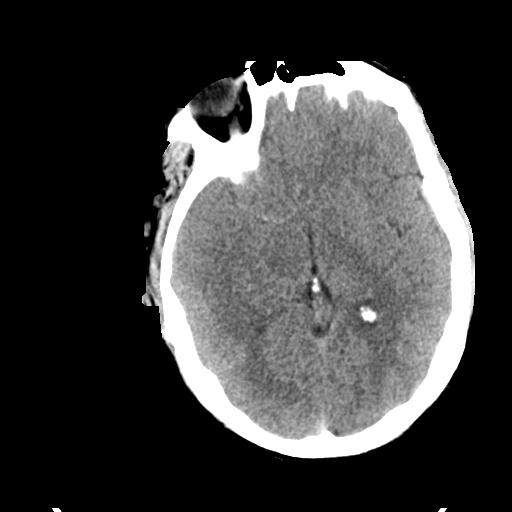
[im 21/34  brain]
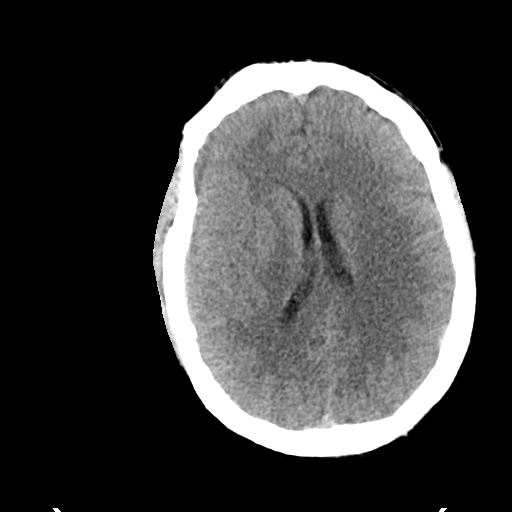
[im 21/34  bone]
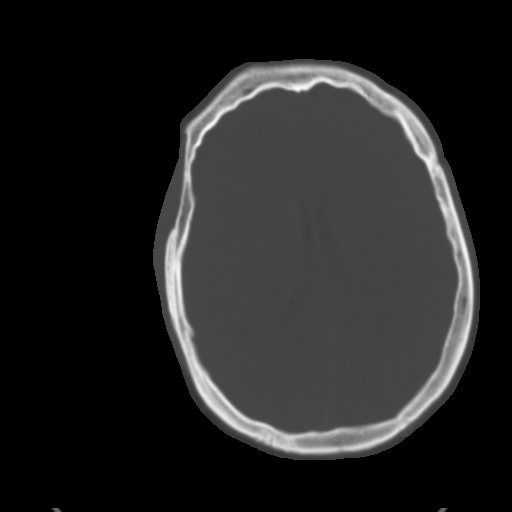
[im 25/34  brain]
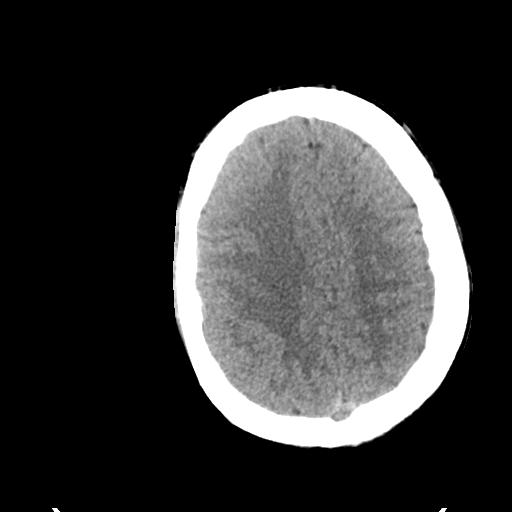
[im 29/34  brain]
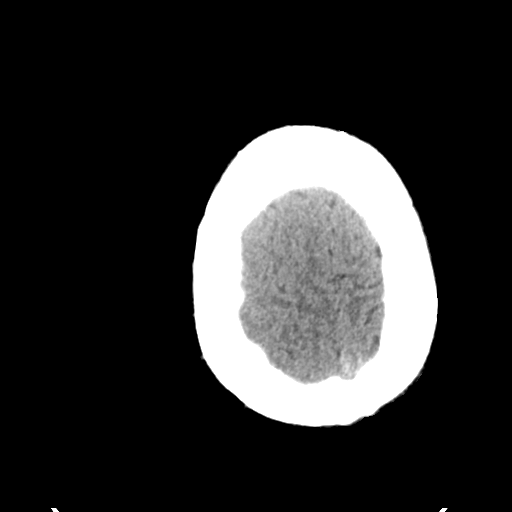

[Series 4: head bone · axial · 0.47mm/px · z∈[-577,-519]mm · 4 of 85 slices shown]
[im 9/85  bone]
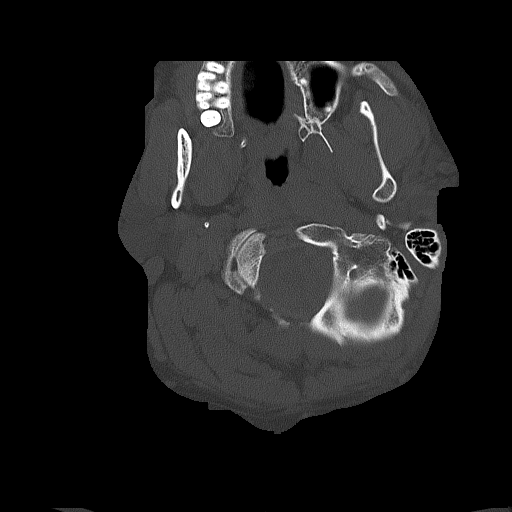
[im 17/85  bone]
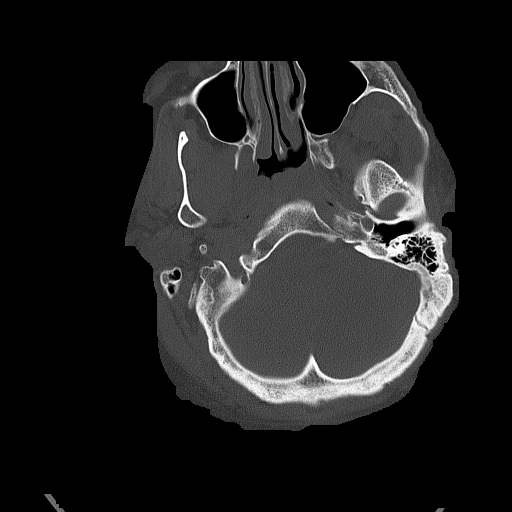
[im 26/85  bone]
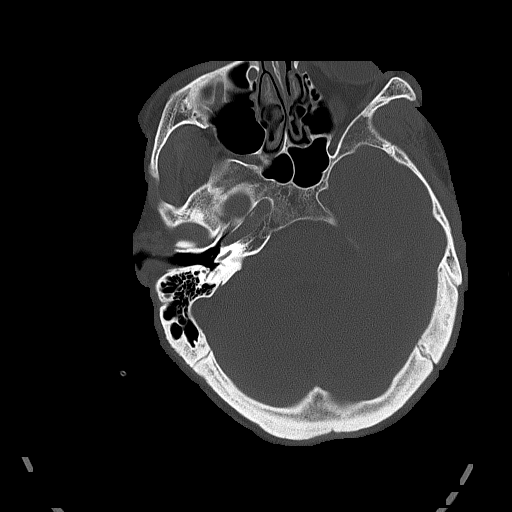
[im 38/85  bone]
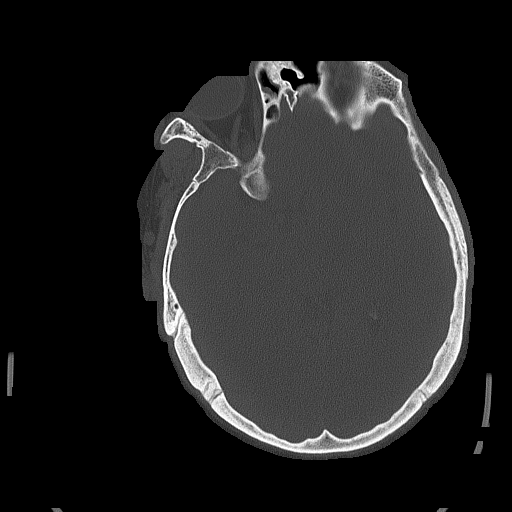

[Series 5: head without cor · coronal · non-contrast · 0.36mm/px · 3 of 67 slices shown]
[im 23/67  brain]
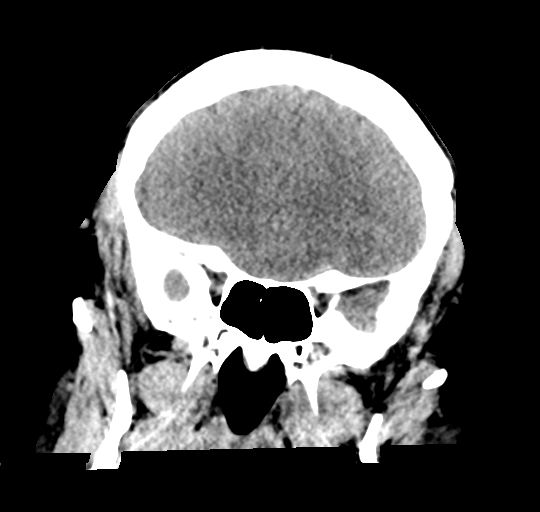
[im 30/67  brain]
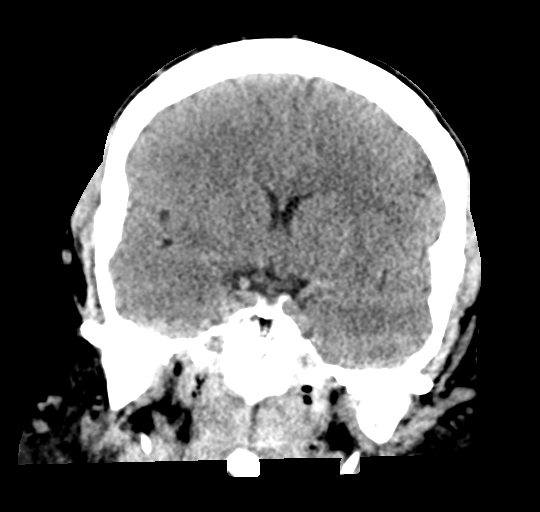
[im 37/67  brain]
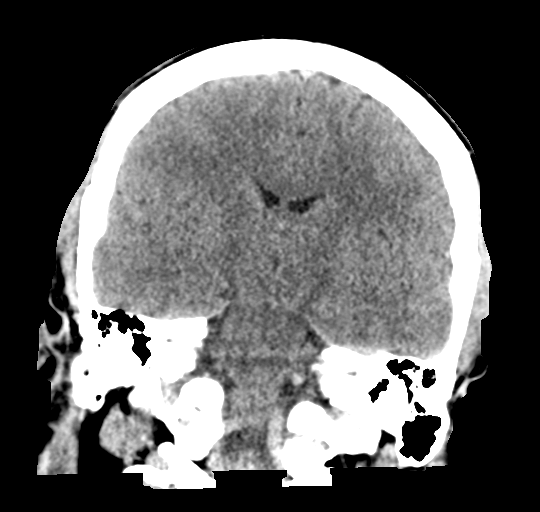

[Series 6: head without sag · sagittal · non-contrast · 0.38mm/px · 3 of 49 slices shown]
[im 17/49  brain]
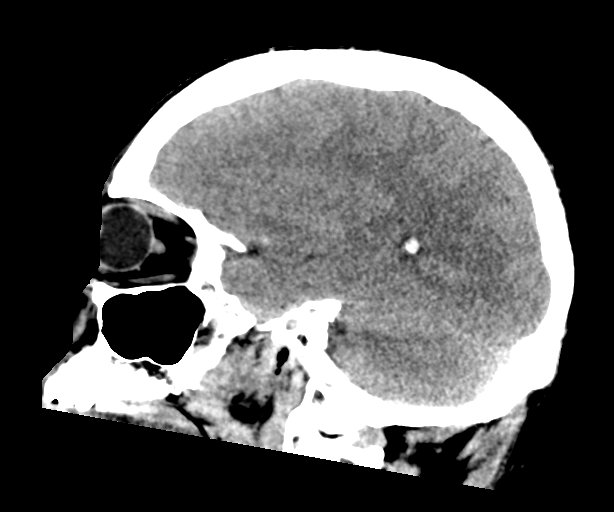
[im 25/49  brain]
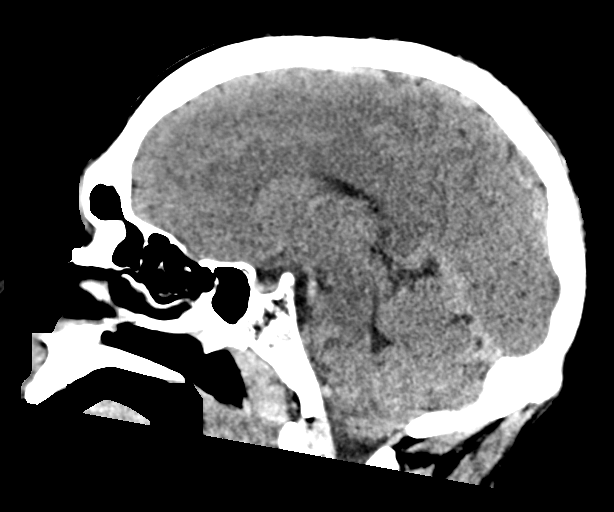
[im 33/49  brain]
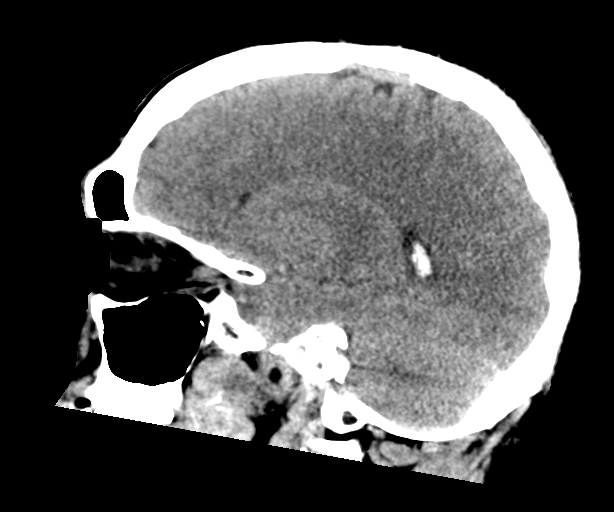

[17 of 47 positions shown; findings below may reference images not displayed]

FINDINGS: Brain: No evidence of acute infarction, hemorrhage, hydrocephalus,
extra-axial collection or mass lesion/mass effect.

Vascular: No hyperdense vessel or unexpected calcification.

Skull: Normal. Negative for fracture or focal lesion.

Sinuses/Orbits: No acute finding.

Other: None.
IMPRESSION: No acute intracranial pathology.

## 2022-03-28 IMAGING — CT CT CERVICAL SPINE W/O CM
3 of 4 series · 13 of 33 positions shown, 16 images · non-contrast
Comparison: None.

CLINICAL DATA: Status post trauma.

EXAM:
CT CERVICAL SPINE WITHOUT CONTRAST
TECHNIQUE: Multidetector CT imaging of the cervical spine was performed without
intravenous contrast. Multiplanar CT image reconstructions were also
generated.

[Series 3: c_spine 2.0 st · axial · 0.26mm/px · z∈[-739,-597]mm · 5 of 107 slices shown, 7 images]
[im 18/107  soft-tissue]
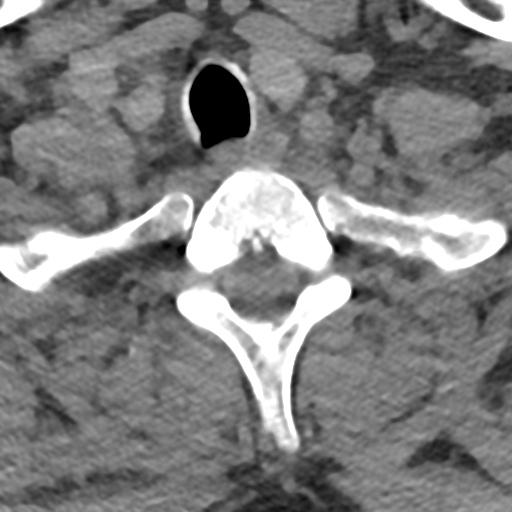
[im 18/107  bone]
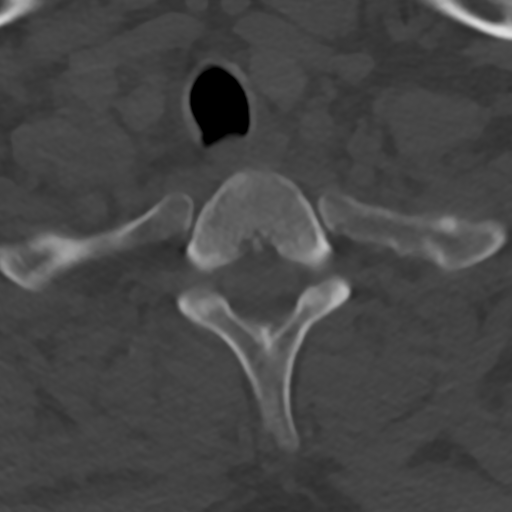
[im 36/107  bone]
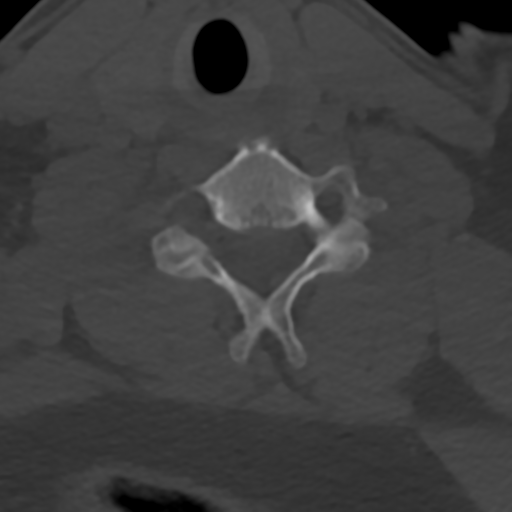
[im 54/107  bone]
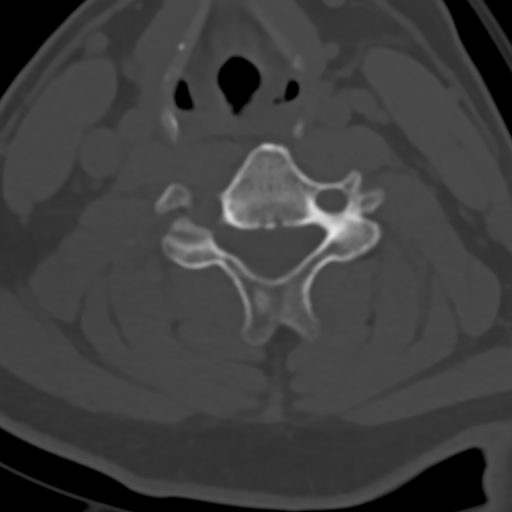
[im 71/107  bone]
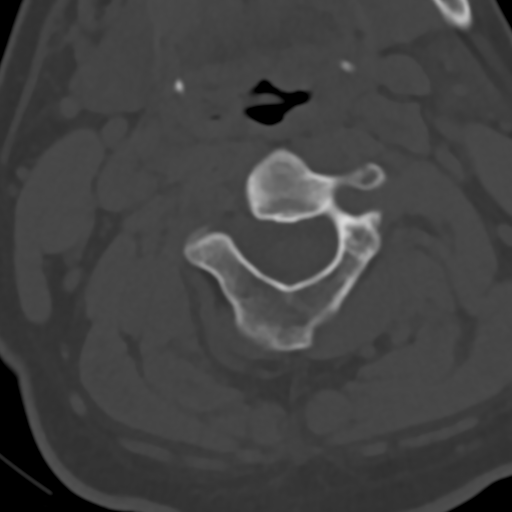
[im 89/107  soft-tissue]
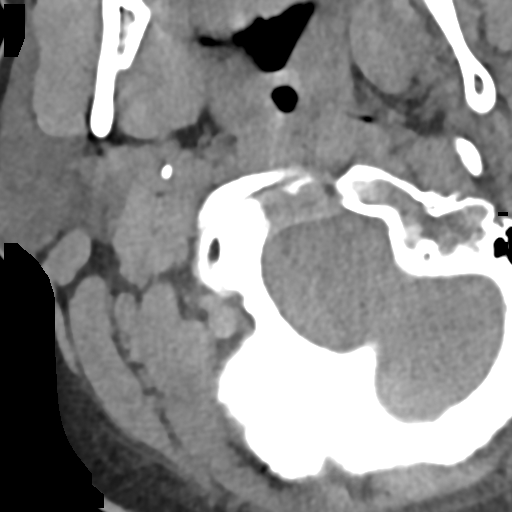
[im 89/107  bone]
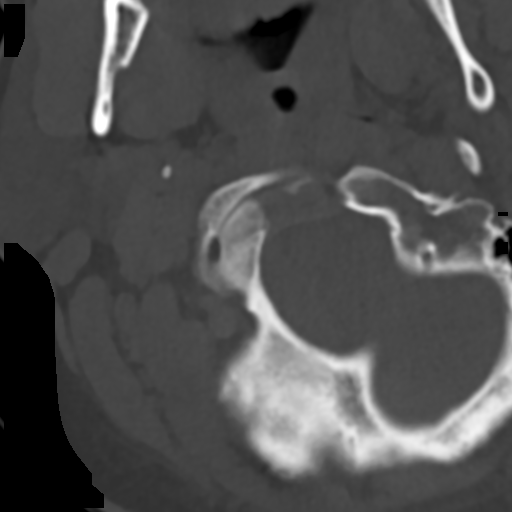

[Series 7: c_spine 2.0 sag bone · sagittal · 0.31mm/px · 5 of 48 slices shown, 6 images]
[im 16/48  bone]
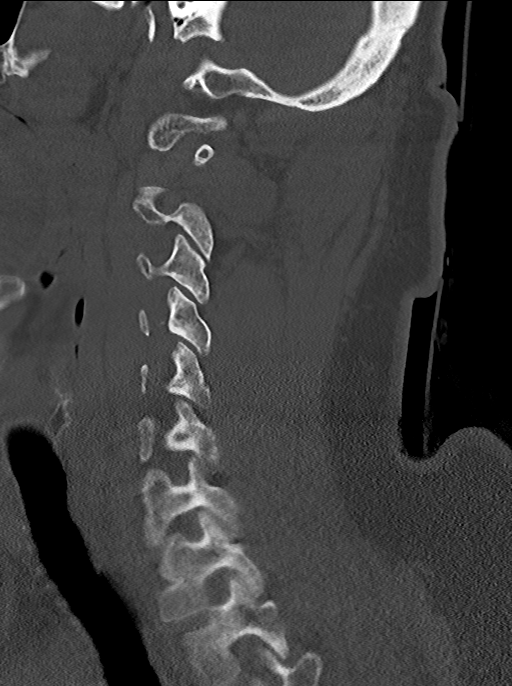
[im 20/48  bone]
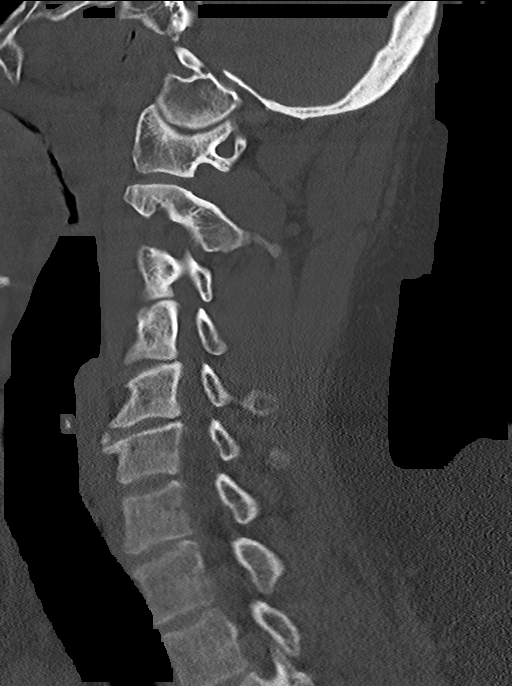
[im 24/48  soft-tissue]
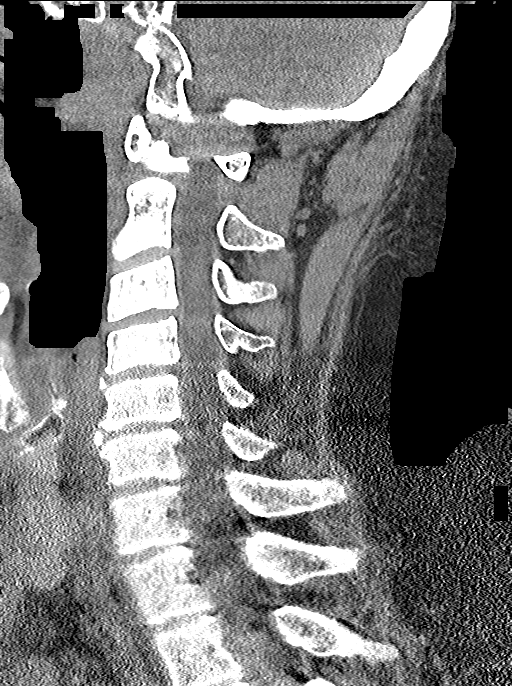
[im 24/48  bone]
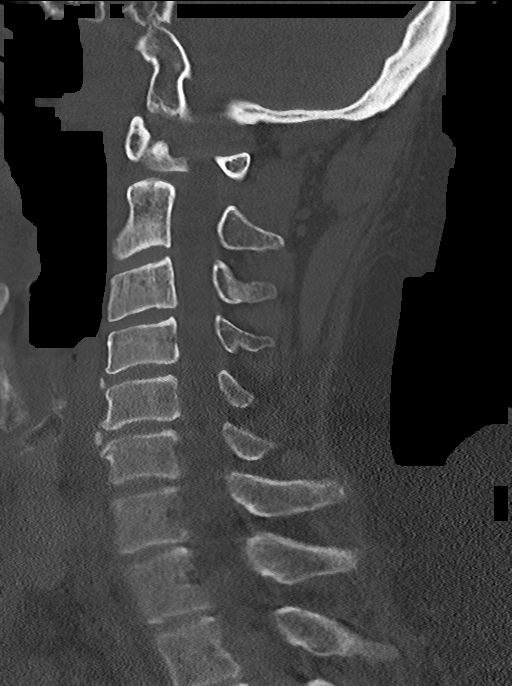
[im 28/48  bone]
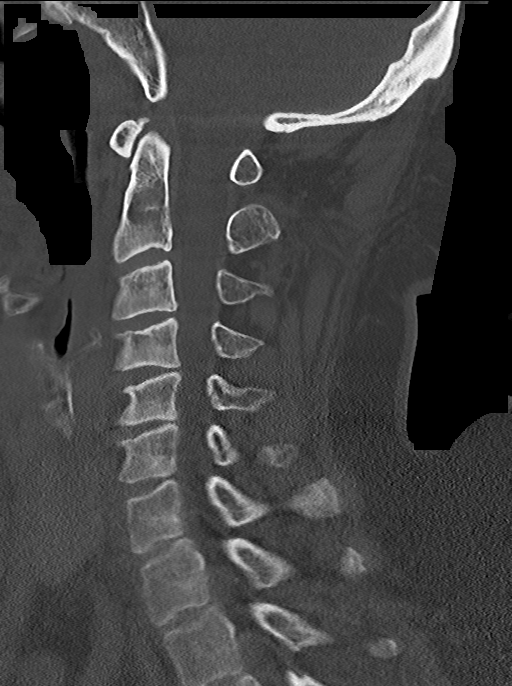
[im 32/48  bone]
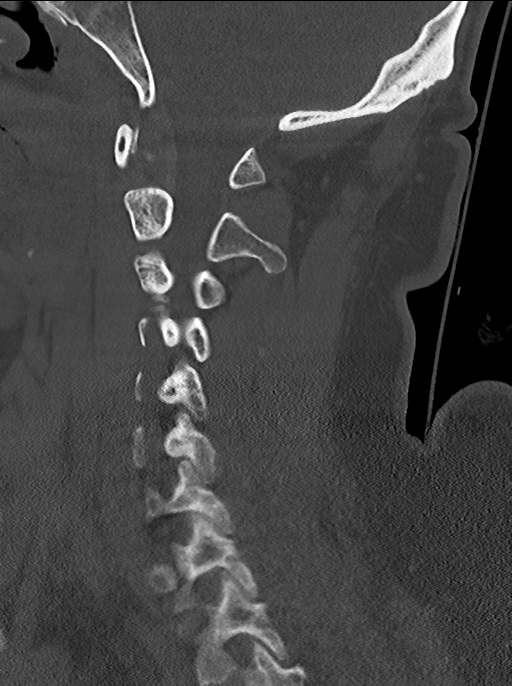

[Series 8: c_spine 2.0 cor bone · coronal · 0.31mm/px · 3 of 61 slices shown]
[im 13/61  bone]
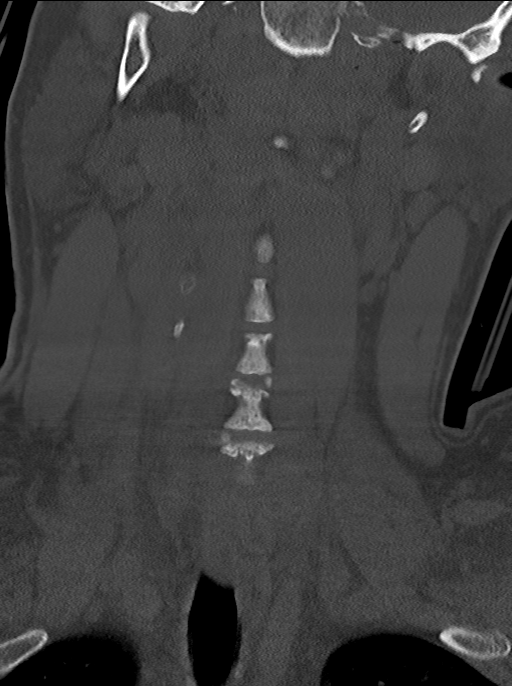
[im 25/61  bone]
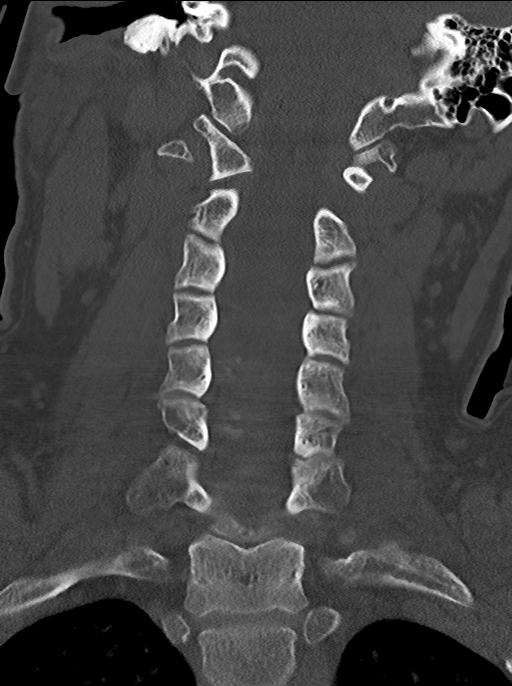
[im 37/61  bone]
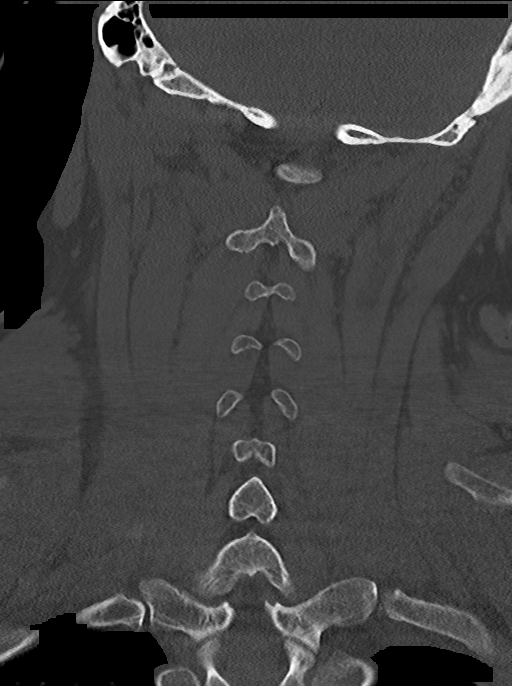

[13 of 33 positions shown; findings below may reference images not displayed]

FINDINGS: Alignment: Normal.

Skull base and vertebrae: No acute fracture. No primary bone lesion
or focal pathologic process.

Soft tissues and spinal canal: No prevertebral fluid or swelling. No
visible canal hematoma.

Disc levels: Early anterior osteophyte formation and/or mild
anterior longitudinal ligament calcification is seen at the levels
of C4-C5 and C5-C6.

Very mild intervertebral disc space narrowing is seen at the levels
of C4-C5 and C5-C6.

Very mild, bilateral multilevel facet joint hypertrophy is noted.

Upper chest: Negative.

Other: None.
IMPRESSION: 1. Very mild degenerative changes at the levels of C4-C5 and C5-C6.
2. No acute cervical spine fracture or subluxation.
